# Patient Record
Sex: Female | Born: 1990 | Race: Black or African American | Hispanic: No | Marital: Single | Smoking: Never smoker
Health system: Southern US, Community
[De-identification: ages and names within clinical notes are randomized; demographics above are authoritative.]

## PROBLEM LIST (undated history)

## (undated) ENCOUNTER — Inpatient Hospital Stay (HOSPITAL_COMMUNITY): Payer: Medicaid Other

## (undated) HISTORY — PX: NO PAST SURGERIES: SHX2092

---
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
## 2010-03-20 ENCOUNTER — Inpatient Hospital Stay (HOSPITAL_COMMUNITY)
Admission: AD | Admit: 2010-03-20 | Discharge: 2010-03-21 | Payer: Self-pay | Source: Home / Self Care | Attending: Obstetrics & Gynecology | Admitting: Obstetrics & Gynecology

## 2010-04-06 NOTE — L&D Delivery Note (Signed)
Delivery Note At 12:20 AM a viable female was delivered via Vaginal, Spontaneous Delivery (Presentation: Left Occiput Anterior).  APGAR: 8, 9; weight 6 lb 13.4 oz (3100 g).   Placenta status: Intact, Spontaneous.  Cord: 3 vessels with the following complications: None.  Cord pH: not done  Anesthesia: Epidural  Episiotomy:  Lacerations: Sulcus;2nd degree;Perineal Suture Repair: 2.0 Est. Blood Loss (mL): 350  Mom to postpartum.  Baby to nursery-stable.  Kobe Ofallon A 10/31/2010, 12:47 AM

## 2010-05-27 ENCOUNTER — Inpatient Hospital Stay (HOSPITAL_COMMUNITY)
Admission: AD | Admit: 2010-05-27 | Discharge: 2010-05-27 | Disposition: A | Discharge status: Home / Self Care | Payer: Medicaid Other | Source: Ambulatory Visit | Attending: Obstetrics & Gynecology | Admitting: Obstetrics & Gynecology

## 2010-05-27 DIAGNOSIS — O210 Mild hyperemesis gravidarum: Secondary | ICD-10-CM | POA: Insufficient documentation

## 2010-05-27 LAB — URINALYSIS, ROUTINE W REFLEX MICROSCOPIC
Bilirubin Urine: NEGATIVE
Ketones, ur: 40 mg/dL — AB
Nitrite: NEGATIVE
Protein, ur: NEGATIVE mg/dL
Urobilinogen, UA: 0.2 mg/dL (ref 0.0–1.0)
pH: 5.5 (ref 5.0–8.0)

## 2010-05-29 ENCOUNTER — Inpatient Hospital Stay (HOSPITAL_COMMUNITY)
Admission: AD | Admit: 2010-05-29 | Discharge: 2010-05-29 | Disposition: A | Discharge status: Home / Self Care | Payer: Medicaid Other | Source: Ambulatory Visit | Attending: Obstetrics & Gynecology | Admitting: Obstetrics & Gynecology

## 2010-05-29 DIAGNOSIS — R109 Unspecified abdominal pain: Secondary | ICD-10-CM | POA: Insufficient documentation

## 2010-05-29 DIAGNOSIS — O9989 Other specified diseases and conditions complicating pregnancy, childbirth and the puerperium: Secondary | ICD-10-CM

## 2010-05-29 DIAGNOSIS — O99891 Other specified diseases and conditions complicating pregnancy: Secondary | ICD-10-CM | POA: Insufficient documentation

## 2010-05-29 LAB — URINALYSIS, ROUTINE W REFLEX MICROSCOPIC
Bilirubin Urine: NEGATIVE
Hgb urine dipstick: NEGATIVE
Specific Gravity, Urine: 1.02 (ref 1.005–1.030)
Urine Glucose, Fasting: NEGATIVE mg/dL
Urobilinogen, UA: 0.2 mg/dL (ref 0.0–1.0)
pH: 6 (ref 5.0–8.0)

## 2010-05-29 LAB — DIFFERENTIAL
Basophils Absolute: 0 10*3/uL (ref 0.0–0.1)
Basophils Relative: 0 % (ref 0–1)
Eosinophils Absolute: 0.1 10*3/uL (ref 0.0–0.7)
Eosinophils Relative: 1 % (ref 0–5)
Lymphs Abs: 1 10*3/uL (ref 0.7–4.0)
Neutrophils Relative %: 68 % (ref 43–77)

## 2010-05-29 LAB — COMPREHENSIVE METABOLIC PANEL
BUN: 6 mg/dL (ref 6–23)
CO2: 23 mEq/L (ref 19–32)
Chloride: 105 mEq/L (ref 96–112)
GFR calc Af Amer: >60 mL/min (ref >60)
GFR calc non Af Amer: >60 mL/min (ref >60)
Glucose, Bld: 78 mg/dL (ref 70–99)
Potassium: 3.3 mEq/L — ABNORMAL LOW (ref 3.5–5.1)
Total Bilirubin: 0.3 mg/dL (ref 0.3–1.2)

## 2010-05-29 LAB — CBC
Hemoglobin: 10 g/dL — ABNORMAL LOW (ref 12.0–15.0)
MCH: 25.5 pg — ABNORMAL LOW (ref 26.0–34.0)
Platelets: 233 10*3/uL (ref 150–400)
WBC: 5.3 10*3/uL (ref 4.0–10.5)

## 2010-05-29 LAB — WET PREP, GENITAL
Clue Cells Wet Prep HPF POC: NONE SEEN
Trich, Wet Prep: NONE SEEN
Yeast Wet Prep HPF POC: NONE SEEN

## 2010-06-16 LAB — CBC
HCT: 32.1 % — ABNORMAL LOW (ref 36.0–46.0)
Hemoglobin: 10.9 g/dL — ABNORMAL LOW (ref 12.0–15.0)
MCH: 25.2 pg — ABNORMAL LOW (ref 26.0–34.0)
MCHC: 34 g/dL (ref 30.0–36.0)
MCV: 74.3 fL — ABNORMAL LOW (ref 78.0–100.0)
RBC: 4.32 MIL/uL (ref 3.87–5.11)

## 2010-06-16 LAB — GC/CHLAMYDIA PROBE AMP, GENITAL: GC Probe Amp, Genital: NEGATIVE

## 2010-06-16 LAB — URINALYSIS, ROUTINE W REFLEX MICROSCOPIC
Bilirubin Urine: NEGATIVE
Glucose, UA: NEGATIVE mg/dL
Ketones, ur: 40 mg/dL — AB
Nitrite: NEGATIVE
Protein, ur: NEGATIVE mg/dL
Urobilinogen, UA: 1 mg/dL (ref 0.0–1.0)

## 2010-06-16 LAB — WET PREP, GENITAL
Trich, Wet Prep: NONE SEEN
Yeast Wet Prep HPF POC: NONE SEEN

## 2010-07-06 ENCOUNTER — Inpatient Hospital Stay (HOSPITAL_COMMUNITY): Admit: 2010-07-06 | Payer: Self-pay | Admitting: Obstetrics

## 2010-09-30 ENCOUNTER — Inpatient Hospital Stay (HOSPITAL_COMMUNITY)
Admission: AD | Admit: 2010-09-30 | Discharge: 2010-09-30 | Disposition: A | Discharge status: Home / Self Care | Payer: Medicaid Other | Source: Ambulatory Visit | Attending: Obstetrics | Admitting: Obstetrics

## 2010-09-30 DIAGNOSIS — O99891 Other specified diseases and conditions complicating pregnancy: Secondary | ICD-10-CM | POA: Insufficient documentation

## 2010-09-30 DIAGNOSIS — K5289 Other specified noninfective gastroenteritis and colitis: Secondary | ICD-10-CM

## 2010-09-30 DIAGNOSIS — O9989 Other specified diseases and conditions complicating pregnancy, childbirth and the puerperium: Secondary | ICD-10-CM

## 2010-09-30 LAB — URINALYSIS, ROUTINE W REFLEX MICROSCOPIC
Glucose, UA: NEGATIVE mg/dL
Hgb urine dipstick: NEGATIVE
Ketones, ur: 40 mg/dL — AB
Nitrite: NEGATIVE
Protein, ur: NEGATIVE mg/dL
Specific Gravity, Urine: 1.02 (ref 1.005–1.030)
Urobilinogen, UA: 0.2 mg/dL (ref 0.0–1.0)
pH: 6 (ref 5.0–8.0)

## 2010-09-30 LAB — URINE MICROSCOPIC-ADD ON

## 2010-10-30 ENCOUNTER — Encounter (HOSPITAL_COMMUNITY): Payer: Self-pay | Admitting: Anesthesiology

## 2010-10-30 ENCOUNTER — Inpatient Hospital Stay (HOSPITAL_COMMUNITY)
Admission: AD | Admit: 2010-10-30 | Discharge: 2010-11-02 | DRG: 775 | Disposition: A | Discharge status: Home / Self Care | Payer: Medicaid Other | Source: Ambulatory Visit | Attending: Obstetrics | Admitting: Obstetrics

## 2010-10-30 ENCOUNTER — Inpatient Hospital Stay (HOSPITAL_COMMUNITY): Payer: Medicaid Other | Admitting: Anesthesiology

## 2010-10-30 ENCOUNTER — Encounter (HOSPITAL_COMMUNITY): Payer: Self-pay | Admitting: *Deleted

## 2010-10-30 LAB — CBC
MCH: 24.6 pg — ABNORMAL LOW (ref 26.0–34.0)
Platelets: 180 10*3/uL (ref 150–400)
RBC: 4.06 MIL/uL (ref 3.87–5.11)
WBC: 5.5 10*3/uL (ref 4.0–10.5)

## 2010-10-30 MED ORDER — LACTATED RINGERS IV SOLN
INTRAVENOUS | Status: DC
  Administered 2010-10-30 (×2): via INTRAVENOUS

## 2010-10-30 MED ORDER — FLEET ENEMA 7-19 GM/118ML RE ENEM: 1.0000 | ENEMA | RECTAL | Status: DC | PRN

## 2010-10-30 MED ORDER — LACTATED RINGERS IV SOLN
500.0000 mL | Freq: Once | INTRAVENOUS | Status: AC
  Administered 2010-10-30: 1000 mL via INTRAVENOUS

## 2010-10-30 MED ORDER — PHENYLEPHRINE 40 MCG/ML (10ML) SYRINGE FOR IV PUSH (FOR BLOOD PRESSURE SUPPORT)
80.0000 ug | PREFILLED_SYRINGE | INTRAVENOUS | Status: DC | PRN
Start: 2010-10-30 — End: 2010-10-31
  Filled 2010-10-30: qty 5

## 2010-10-30 MED ORDER — ONDANSETRON HCL 4 MG/2ML IJ SOLN: 4.0000 mg | Freq: Four times a day (QID) | INTRAMUSCULAR | Status: DC | PRN

## 2010-10-30 MED ORDER — FENTANYL 2.5 MCG/ML BUPIVACAINE 1/10 % EPIDURAL INFUSION (WH - ANES)
INTRAMUSCULAR | Status: DC | PRN
  Administered 2010-10-30: 14 mL/h via EPIDURAL

## 2010-10-30 MED ORDER — LIDOCAINE HCL (PF) 1 % IJ SOLN
30.0000 mL | INTRAMUSCULAR | Status: DC | PRN
  Administered 2010-10-31: 30 mL via SUBCUTANEOUS
  Filled 2010-10-30: qty 30

## 2010-10-30 MED ORDER — OXYTOCIN 20 UNITS IN LACTATED RINGERS INFUSION - SIMPLE
125.0000 mL/h | Freq: Once | INTRAVENOUS | Status: DC
  Administered 2010-10-31: 125 mL/h via INTRAVENOUS
  Filled 2010-10-30: qty 1000

## 2010-10-30 MED ORDER — EPHEDRINE 5 MG/ML INJ
10.0000 mg | INTRAVENOUS | Status: DC | PRN
  Filled 2010-10-30: qty 4

## 2010-10-30 MED ORDER — DIPHENHYDRAMINE HCL 50 MG/ML IJ SOLN: 12.5000 mg | INTRAMUSCULAR | Status: DC | PRN

## 2010-10-30 MED ORDER — CITRIC ACID-SODIUM CITRATE 334-500 MG/5ML PO SOLN: 30.0000 mL | ORAL | Status: DC | PRN

## 2010-10-30 MED ORDER — EPHEDRINE 5 MG/ML INJ
10.0000 mg | INTRAVENOUS | Status: DC | PRN
  Filled 2010-10-30 (×2): qty 4

## 2010-10-30 MED ORDER — PHENYLEPHRINE 40 MCG/ML (10ML) SYRINGE FOR IV PUSH (FOR BLOOD PRESSURE SUPPORT)
80.0000 ug | PREFILLED_SYRINGE | INTRAVENOUS | Status: DC | PRN
  Filled 2010-10-30 (×2): qty 5

## 2010-10-30 MED ORDER — FENTANYL 2.5 MCG/ML BUPIVACAINE 1/10 % EPIDURAL INFUSION (WH - ANES)
14.0000 mL/h | INTRAMUSCULAR | Status: DC
  Filled 2010-10-30: qty 60

## 2010-10-30 MED ORDER — LACTATED RINGERS IV SOLN: 500.0000 mL | INTRAVENOUS | Status: DC | PRN

## 2010-10-30 NOTE — Anesthesia Procedure Notes (Addendum)
Epidural Patient location during procedure: OB Start time: 10/30/2010 11:07 PM End time: 10/30/2010 11:19 PM  Staffing Anesthesiologist: Sandrea Hughs Performed by: anesthesiologist   Preanesthetic Checklist Completed: patient identified, site marked, surgical consent, pre-op evaluation, timeout performed, IV checked, risks and benefits discussed and monitors and equipment checked  Epidural Patient position: sitting Prep: site prepped and draped and DuraPrep Patient monitoring: continuous pulse ox and blood pressure Approach: midline Injection technique: LOR air  Needle:  Needle type: Tuohy  Needle gauge: 17 G Needle length: 9 cm Needle insertion depth: 5 cm cm Catheter type: closed end flexible Catheter size: 19 Gauge Catheter at skin depth: 10 cm Test dose: negative and 1.5% lidocaine  Assessment Sensory level: T8 Events: blood not aspirated, injection not painful, no injection resistance, negative IV test and no paresthesia  Additional Notes Pt comfortable. See nursing notes for VS and FHR

## 2010-10-30 NOTE — Anesthesia Preprocedure Evaluation (Signed)
Anesthesia Evaluation  Name, MR# and DOB Patient awake  General Assessment Comment  Reviewed: Allergy & Precautions, H&P  and Patient's Chart, lab work & pertinent test results  Airway Mallampati: II TM Distance: >3 FB Neck ROM: full    Dental No notable dental hx    Pulmonaryneg pulmonary ROS      pulmonary exam normal   Cardiovascular regular Normal   Neuro/PsychNegative Neurological ROS Negative Psych ROS  GI/Hepatic/Renal negative GI ROS, negative Liver ROS, and negative Renal ROS (+)       Endo/Other  Negative Endocrine ROS (+)   Abdominal   Musculoskeletal  Hematology negative hematology ROS (+)   Peds  Reproductive/Obstetrics (+) Pregnancy   Anesthesia Other Findings             Anesthesia Physical Anesthesia Plan  ASA: II  Anesthesia Plan: Epidural   Post-op Pain Management:    Induction:   Airway Management Planned:   Additional Equipment:   Intra-op Plan:   Post-operative Plan:   Informed Consent: I have reviewed the patients History and Physical, chart, labs and discussed the procedure including the risks, benefits and alternatives for the proposed anesthesia with the patient or authorized representative who has indicated his/her understanding and acceptance.     Plan Discussed with:   Anesthesia Plan Comments:         Anesthesia Quick Evaluation  

## 2010-10-31 ENCOUNTER — Encounter (HOSPITAL_COMMUNITY): Payer: Self-pay | Admitting: General Practice

## 2010-10-31 LAB — CBC
Hemoglobin: 8.9 g/dL — ABNORMAL LOW (ref 12.0–15.0)
RBC: 3.61 MIL/uL — ABNORMAL LOW (ref 3.87–5.11)

## 2010-10-31 LAB — RPR: RPR Ser Ql: NONREACTIVE

## 2010-10-31 MED ORDER — SODIUM CHLORIDE 0.9 % IV SOLN: 250.0000 mL | INTRAVENOUS | Status: DC

## 2010-10-31 MED ORDER — OXYCODONE-ACETAMINOPHEN 5-325 MG PO TABS
1.0000 | ORAL_TABLET | ORAL | Status: DC | PRN
  Administered 2010-10-31 (×3): 1 via ORAL
  Administered 2010-10-31: 2 via ORAL
  Administered 2010-10-31 – 2010-11-01 (×5): 1 via ORAL
  Administered 2010-11-01: 2 via ORAL
  Administered 2010-11-01 – 2010-11-02 (×5): 1 via ORAL
  Administered 2010-11-02: 2 via ORAL
  Filled 2010-10-31 (×2): qty 1
  Filled 2010-10-31 (×7): qty 2
  Filled 2010-10-31 (×3): qty 1
  Filled 2010-10-31: qty 2
  Filled 2010-10-31: qty 1
  Filled 2010-10-31 (×2): qty 2
  Filled 2010-10-31: qty 1
  Filled 2010-10-31 (×4): qty 2
  Filled 2010-10-31: qty 1

## 2010-10-31 MED ORDER — BENZOCAINE-MENTHOL 20-0.5 % EX AERO
INHALATION_SPRAY | CUTANEOUS | Status: AC
  Administered 2010-10-31: 1 via TOPICAL
  Filled 2010-10-31: qty 56

## 2010-10-31 MED ORDER — DIPHENHYDRAMINE HCL 25 MG PO CAPS: 25.0000 mg | ORAL_CAPSULE | Freq: Four times a day (QID) | ORAL | Status: DC | PRN

## 2010-10-31 MED ORDER — SODIUM CHLORIDE 0.9 % IJ SOLN: 3.0000 mL | INTRAMUSCULAR | Status: DC | PRN

## 2010-10-31 MED ORDER — ZOLPIDEM TARTRATE 5 MG PO TABS: 5.0000 mg | ORAL_TABLET | Freq: Every evening | ORAL | Status: DC | PRN

## 2010-10-31 MED ORDER — FERROUS SULFATE 325 (65 FE) MG PO TABS
325.0000 mg | ORAL_TABLET | Freq: Two times a day (BID) | ORAL | Status: DC
  Administered 2010-10-31 – 2010-11-02 (×5): 325 mg via ORAL
  Filled 2010-10-31 (×5): qty 1

## 2010-10-31 MED ORDER — BENZOCAINE-MENTHOL 20-0.5 % EX AERO
1.0000 "application " | INHALATION_SPRAY | CUTANEOUS | Status: DC | PRN
  Administered 2010-10-31: 1 via TOPICAL

## 2010-10-31 MED ORDER — LANOLIN HYDROUS EX OINT: TOPICAL_OINTMENT | CUTANEOUS | Status: DC | PRN

## 2010-10-31 MED ORDER — OXYTOCIN 20 UNITS IN LACTATED RINGERS INFUSION - SIMPLE: 125.0000 mL/h | INTRAVENOUS | Status: DC | PRN

## 2010-10-31 MED ORDER — WITCH HAZEL-GLYCERIN EX PADS: MEDICATED_PAD | CUTANEOUS | Status: DC | PRN

## 2010-10-31 MED ORDER — SODIUM CHLORIDE 0.9 % IJ SOLN: 3.0000 mL | Freq: Two times a day (BID) | INTRAMUSCULAR | Status: DC

## 2010-10-31 MED ORDER — IBUPROFEN 600 MG PO TABS
600.0000 mg | ORAL_TABLET | Freq: Four times a day (QID) | ORAL | Status: DC
  Administered 2010-10-31 – 2010-11-02 (×10): 600 mg via ORAL
  Filled 2010-10-31 (×10): qty 1

## 2010-10-31 NOTE — Progress Notes (Signed)
[facility] INFORMATION GIVEN TO PATIENT.  PATIENT STATES THE BABY IS LATCHING WELL BUT SHE CHOOSES TO GIVE BOTTLES ALSO.  ENCOURAGED TO CALL WITH QUESTIONS OR ASSIST.

## 2010-10-31 NOTE — Progress Notes (Signed)
  Vital signs normal Fundus firm Lochia moderate Legs negative

## 2010-10-31 NOTE — Anesthesia Postprocedure Evaluation (Signed)
Vital signs stable Patient alert Pain and nausea are controlled No apparent anesthetic complications No follow up care needed Pt may be d/c when legs are bending

## 2010-10-31 NOTE — H&P (Signed)
This is Dr. Francoise Ceo dictating the history and physical on blank blank She's a 20 year old gravida 1 para 1001 at 38 weeks and 2 days who was admitted in labor 3 cm 90% vertex at +1 station Negative GBS unknown allergic to Tylox and aspirin On admission she under epidural and made rapid progress She had a normal vaginal delivery of a female Apgar 8 placenta was spontaneous HEENT Negative  Breasts negative Heart regular rhythm no murmurs no gallops Lungs clear to P&A Abdomen 20 week postpartum size Pelvic as described above Extremities negative

## 2010-10-31 NOTE — Progress Notes (Signed)
UR Chart review completed.  

## 2010-10-31 NOTE — Anesthesia Postprocedure Evaluation (Signed)
  Anesthesia Post-op Note  Patient: Kathy Hardin  Procedure(s) Performed: * No procedures listed *  Patient Location: Rm 131  Anesthesia Type: Epidural  Level of Consciousness: awake, alert , oriented and patient cooperative  Airway and Oxygen Therapy: Patient Spontanous Breathing   Post-op Assessment: Post-op Vital signs reviewed and Patient's Cardiovascular Status Stable  Post-op Vital Signs: Reviewed and stable  Complications: No apparent anesthesia complications

## 2010-10-31 NOTE — Progress Notes (Signed)
I/O cath  . 

## 2010-11-01 MED ORDER — DOCUSATE SODIUM 100 MG PO CAPS
100.0000 mg | ORAL_CAPSULE | Freq: Two times a day (BID) | ORAL | Status: DC | PRN
  Administered 2010-11-01: 100 mg via ORAL
  Filled 2010-11-01: qty 1

## 2010-11-01 NOTE — Progress Notes (Signed)
  Post Partum Day 1 S/P spontaneous vaginal RH status/Rubella reviewed.  Feeding: breast Subjective: No HA, SOB, CP, F/C, breast symptoms. Normal vaginal bleeding, no clots.     Objective: BP 100/63  Pulse 80  Temp(Src) 98.1 F (36.7 C) (Oral)  Resp 18  Ht 5\' 5"  (1.651 m)  Wt 87.544 kg (193 lb)  BMI 32.12 kg/m2  SpO2 95%  Breastfeeding? Unknown   Physical Exam:  General: alert Lochia: appropriate Uterine Fundus: firm DVT Evaluation: No evidence of DVT seen on physical exam. Ext: No c/c/e  Basename 10/31/10 0525 10/30/10 2228  HGB 8.9* 10.0*  HCT 26.9* 30.5*      Assessment/Plan: 20 y.o.  PPD #1 .  normal postpartum exam Continue current postpartum care  Ambulate   LOS: 2 days   JACKSON-MOORE,Rico Massar A 11/01/2010, 10:18 AM

## 2010-11-01 NOTE — Progress Notes (Signed)
Basic breastfeeding review. Assisted with good latch. Audible swallowing with rhythmic sucking pattern, hand pump given , mom pumped 10 ml colostrum.

## 2010-11-02 MED ORDER — OXYCODONE-ACETAMINOPHEN 5-325 MG PO TABS: 2.0000 | ORAL_TABLET | Freq: Four times a day (QID) | ORAL | Status: AC | PRN

## 2010-11-02 NOTE — Progress Notes (Signed)
Mother breast fed several times during the night and bottle fed as well., encouraged to pump and use own milk.

## 2010-11-02 NOTE — Discharge Summary (Signed)
  Obstetric Discharge Summary Reason for Admission: onset of labor Prenatal Procedures: none Intrapartum Procedures: spontaneous vaginal delivery Postpartum Procedures: none Complications-Operative and Postpartum: none  Hemoglobin  Date Value Range Status  10/31/2010 8.9* 12.0-15.0 (g/dL) Final     HCT  Date Value Range Status  10/31/2010 26.9* 36.0-46.0 (%) Final    Discharge Diagnoses: Term Pregnancy-delivered  Discharge Information: Date: 11/02/2010 Activity: pelvic rest Diet: routine Medications: Ibuprophen Condition: stable Instructions: refer to routine discharge instructions Discharge to: home Follow-up Information    Follow up with MARSHALL,BERNARD A, MD. Call in 6 weeks.   Contact information:   [address] [place] [address] [phone]          Newborn Data: Live born  Information for the patient's newborn:  Rafia, Shedden [[id number]]  female ; APGAR , ; weight ;  Home with mother.  JACKSON-MOORE,Vencent Hauschild A 11/02/2010, 9:15 AM

## 2010-11-02 NOTE — Progress Notes (Signed)
  Post Partum Day #2 S/P:spontaneous vaginal  RH status/Rubella reviewed.  Feeding: bottle Subjective: No HA, SOB, CP, F/C, breast symptoms: No:. Normal vaginal bleeding, no clots.     Objective:  Blood pressure 103/62, pulse 77, temperature 98.3 F (36.8 C), temperature source Oral, resp. rate 18, height 5\' 5"  (1.651 m), weight 87.544 kg (193 lb), SpO2 95.00%, unknown if currently breastfeeding.   Physical Exam:  General: alert Lochia: appropriate Uterine Fundus: firm DVT Evaluation: No evidence of DVT seen on physical exam. Ext: No c/c/e  Basename 10/31/10 0525 10/30/10 2228  HGB 8.9* 10.0*  HCT 26.9* 30.5*    Assessment/Plan: 19 y.o.  PPD # 2 .  normal postpartum exam Continue current postpartum care D/C home   LOS: 3 days   JACKSON-MOORE,Jeremia Groot A 11/02/2010, 9:12 AM

## 2011-10-09 ENCOUNTER — Emergency Department: Payer: Self-pay | Admitting: *Deleted

## 2011-10-09 LAB — URINALYSIS, COMPLETE
Bilirubin,UR: NEGATIVE
Blood: NEGATIVE
Nitrite: NEGATIVE
Ph: 5 (ref 4.5–8.0)
Protein: NEGATIVE
Specific Gravity: 1.018 (ref 1.003–1.030)

## 2011-10-09 LAB — COMPREHENSIVE METABOLIC PANEL
Bilirubin,Total: 0.2 mg/dL (ref 0.2–1.0)
Chloride: 109 mmol/L — ABNORMAL HIGH (ref 98–107)
Co2: 28 mmol/L (ref 21–32)
Creatinine: 1.13 mg/dL (ref 0.60–1.30)
Osmolality: 281 (ref 275–301)
SGOT(AST): 14 U/L — ABNORMAL LOW (ref 15–37)
SGPT (ALT): 12 U/L

## 2011-10-09 LAB — CBC
MCV: 78 fL — ABNORMAL LOW (ref 80–100)
Platelet: 212 10*3/uL (ref 150–440)
RBC: 4.6 10*6/uL (ref 3.80–5.20)
RDW: 15.5 % — ABNORMAL HIGH (ref 11.5–14.5)
WBC: 6.8 10*3/uL (ref 3.6–11.0)

## 2011-10-09 LAB — PREGNANCY, URINE: Pregnancy Test, Urine: NEGATIVE m[IU]/mL

## 2011-11-12 ENCOUNTER — Emergency Department: Payer: Self-pay | Admitting: Emergency Medicine

## 2011-11-12 LAB — URINALYSIS, COMPLETE
Blood: NEGATIVE
Glucose,UR: NEGATIVE mg/dL (ref 0–75)
Nitrite: NEGATIVE
Protein: NEGATIVE

## 2011-11-12 LAB — COMPREHENSIVE METABOLIC PANEL
Alkaline Phosphatase: 54 U/L (ref 50–136)
Bilirubin,Total: 0.2 mg/dL (ref 0.2–1.0)
Calcium, Total: 8.8 mg/dL (ref 8.5–10.1)
Chloride: 107 mmol/L (ref 98–107)
Co2: 25 mmol/L (ref 21–32)
Creatinine: 0.64 mg/dL (ref 0.60–1.30)
EGFR (African American): >60
EGFR (Non-African Amer.): >60
Osmolality: 276 (ref 275–301)
Potassium: 3.7 mmol/L (ref 3.5–5.1)
SGPT (ALT): 10 U/L — ABNORMAL LOW (ref 12–78)

## 2011-11-12 LAB — CBC
HCT: 35.7 % (ref 35.0–47.0)
HGB: 12.1 g/dL (ref 12.0–16.0)
RBC: 4.63 10*6/uL (ref 3.80–5.20)
WBC: 6.3 10*3/uL (ref 3.6–11.0)

## 2011-11-12 LAB — PREGNANCY, URINE: Pregnancy Test, Urine: POSITIVE m[IU]/mL

## 2012-02-08 ENCOUNTER — Emergency Department: Payer: Self-pay | Admitting: Emergency Medicine

## 2012-02-08 LAB — URINALYSIS, COMPLETE
Bacteria: NONE SEEN
Bilirubin,UR: NEGATIVE
Leukocyte Esterase: NEGATIVE
Ph: 5 (ref 4.5–8.0)
Protein: NEGATIVE

## 2012-09-21 ENCOUNTER — Emergency Department: Payer: Self-pay | Admitting: Emergency Medicine

## 2012-09-21 LAB — COMPREHENSIVE METABOLIC PANEL
Albumin: 3.5 g/dL (ref 3.4–5.0)
Anion Gap: 5 — ABNORMAL LOW (ref 7–16)
BUN: 9 mg/dL (ref 7–18)
Co2: 25 mmol/L (ref 21–32)
Creatinine: 0.71 mg/dL (ref 0.60–1.30)
EGFR (Non-African Amer.): >60
SGPT (ALT): 11 U/L — ABNORMAL LOW (ref 12–78)

## 2012-09-21 LAB — URINALYSIS, COMPLETE
Bilirubin,UR: NEGATIVE
Glucose,UR: NEGATIVE mg/dL (ref 0–75)
Ketone: NEGATIVE
Nitrite: NEGATIVE
Protein: NEGATIVE
RBC,UR: <1 /HPF (ref 0–5)

## 2012-09-21 LAB — CBC
HCT: 33.7 % — ABNORMAL LOW (ref 35.0–47.0)
MCH: 26.6 pg (ref 26.0–34.0)
MCHC: 34 g/dL (ref 32.0–36.0)
MCV: 78 fL — ABNORMAL LOW (ref 80–100)
RBC: 4.32 10*6/uL (ref 3.80–5.20)

## 2012-09-21 LAB — LIPASE, BLOOD: Lipase: 83 U/L (ref 73–393)

## 2012-09-22 LAB — HCG, QUANTITATIVE, PREGNANCY: Beta Hcg, Quant.: 67842 m[IU]/mL — ABNORMAL HIGH

## 2012-10-18 ENCOUNTER — Ambulatory Visit: Payer: Self-pay | Admitting: Family Medicine

## 2013-04-10 ENCOUNTER — Observation Stay: Payer: Self-pay

## 2013-04-11 ENCOUNTER — Observation Stay: Payer: Self-pay | Admitting: Obstetrics and Gynecology

## 2013-04-18 ENCOUNTER — Inpatient Hospital Stay: Payer: Self-pay | Admitting: Obstetrics and Gynecology

## 2013-04-19 LAB — CBC WITH DIFFERENTIAL/PLATELET
BASOS ABS: 0 10*3/uL (ref 0.0–0.1)
BASOS PCT: 0.3 %
EOS PCT: 0.7 %
Eosinophil #: 0.1 10*3/uL (ref 0.0–0.7)
HCT: 29.3 % — ABNORMAL LOW (ref 35.0–47.0)
HGB: 9.8 g/dL — ABNORMAL LOW (ref 12.0–16.0)
LYMPHS PCT: 19.6 %
Lymphocyte #: 1.5 10*3/uL (ref 1.0–3.6)
MCH: 24.6 pg — AB (ref 26.0–34.0)
MCHC: 33.6 g/dL (ref 32.0–36.0)
MCV: 73 fL — AB (ref 80–100)
Monocyte #: 0.5 x10 3/mm (ref 0.2–0.9)
Monocyte %: 6.7 %
NEUTROS PCT: 72.7 %
Neutrophil #: 5.7 10*3/uL (ref 1.4–6.5)
Platelet: 224 10*3/uL (ref 150–440)
RBC: 4 10*6/uL (ref 3.80–5.20)
RDW: 15.1 % — AB (ref 11.5–14.5)
WBC: 7.8 10*3/uL (ref 3.6–11.0)

## 2013-04-19 LAB — WOUND CULTURE

## 2013-04-20 LAB — HEMOGLOBIN: HGB: 9.2 g/dL — AB (ref 12.0–16.0)

## 2013-12-30 ENCOUNTER — Emergency Department: Payer: Self-pay | Admitting: Emergency Medicine

## 2013-12-30 LAB — URINALYSIS, COMPLETE
BILIRUBIN, UR: NEGATIVE
Bacteria: NONE SEEN
Blood: NEGATIVE
Glucose,UR: NEGATIVE mg/dL (ref 0–75)
Ketone: NEGATIVE
Nitrite: NEGATIVE
Ph: 6 (ref 4.5–8.0)
Protein: NEGATIVE
RBC,UR: 1 /HPF (ref 0–5)
SPECIFIC GRAVITY: 1.013 (ref 1.003–1.030)

## 2013-12-30 LAB — CBC WITH DIFFERENTIAL/PLATELET
BASOS ABS: 0 10*3/uL (ref 0.0–0.1)
BASOS PCT: 0.5 %
EOS ABS: 0.1 10*3/uL (ref 0.0–0.7)
Eosinophil %: 1.5 %
HCT: 30.5 % — AB (ref 35.0–47.0)
HGB: 9.6 g/dL — ABNORMAL LOW (ref 12.0–16.0)
LYMPHS ABS: 1 10*3/uL (ref 1.0–3.6)
Lymphocyte %: 16.6 %
MCH: 24.3 pg — AB (ref 26.0–34.0)
MCHC: 31.5 g/dL — ABNORMAL LOW (ref 32.0–36.0)
MCV: 77 fL — ABNORMAL LOW (ref 80–100)
MONO ABS: 0.3 x10 3/mm (ref 0.2–0.9)
MONOS PCT: 4.9 %
NEUTROS ABS: 4.8 10*3/uL (ref 1.4–6.5)
Neutrophil %: 76.5 %
Platelet: 202 10*3/uL (ref 150–440)
RBC: 3.95 10*6/uL (ref 3.80–5.20)
RDW: 13.9 % (ref 11.5–14.5)
WBC: 6.3 10*3/uL (ref 3.6–11.0)

## 2013-12-30 LAB — COMPREHENSIVE METABOLIC PANEL
ALT: 8 U/L — AB
Albumin: 2.3 g/dL — ABNORMAL LOW (ref 3.4–5.0)
Alkaline Phosphatase: 75 U/L
Anion Gap: 5 — ABNORMAL LOW (ref 7–16)
BUN: 3 mg/dL — AB (ref 7–18)
Bilirubin,Total: 0.1 mg/dL — ABNORMAL LOW (ref 0.2–1.0)
CALCIUM: 7.7 mg/dL — AB (ref 8.5–10.1)
CO2: 26 mmol/L (ref 21–32)
CREATININE: 0.58 mg/dL — AB (ref 0.60–1.30)
Chloride: 109 mmol/L — ABNORMAL HIGH (ref 98–107)
EGFR (Non-African Amer.): >60
GLUCOSE: 75 mg/dL (ref 65–99)
OSMOLALITY: 275 (ref 275–301)
POTASSIUM: 3.2 mmol/L — AB (ref 3.5–5.1)
SGOT(AST): 13 U/L — ABNORMAL LOW (ref 15–37)
Sodium: 140 mmol/L (ref 136–145)
TOTAL PROTEIN: 6.5 g/dL (ref 6.4–8.2)

## 2013-12-30 LAB — LIPASE, BLOOD: Lipase: 56 U/L — ABNORMAL LOW (ref 73–393)

## 2013-12-30 LAB — WET PREP, GENITAL

## 2013-12-30 LAB — HCG, QUANTITATIVE, PREGNANCY: BETA HCG, QUANT.: 11384 m[IU]/mL — AB

## 2014-01-01 LAB — URINE CULTURE

## 2014-02-05 ENCOUNTER — Encounter (HOSPITAL_COMMUNITY): Payer: Self-pay | Admitting: General Practice

## 2014-02-27 ENCOUNTER — Inpatient Hospital Stay: Payer: Self-pay | Admitting: Obstetrics & Gynecology

## 2014-02-27 LAB — CBC WITH DIFFERENTIAL/PLATELET
Basophil #: 0 10*3/uL (ref 0.0–0.1)
Basophil %: 0.4 %
EOS PCT: 0.6 %
Eosinophil #: 0 10*3/uL (ref 0.0–0.7)
HCT: 29.5 % — AB (ref 35.0–47.0)
HGB: 9.5 g/dL — AB (ref 12.0–16.0)
LYMPHS PCT: 18.6 %
Lymphocyte #: 1.3 10*3/uL (ref 1.0–3.6)
MCH: 23.9 pg — ABNORMAL LOW (ref 26.0–34.0)
MCHC: 32.3 g/dL (ref 32.0–36.0)
MCV: 74 fL — AB (ref 80–100)
MONO ABS: 0.5 x10 3/mm (ref 0.2–0.9)
MONOS PCT: 6.9 %
Neutrophil #: 5.1 10*3/uL (ref 1.4–6.5)
Neutrophil %: 73.5 %
Platelet: 223 10*3/uL (ref 150–440)
RBC: 3.99 10*6/uL (ref 3.80–5.20)
RDW: 16.1 % — AB (ref 11.5–14.5)
WBC: 6.9 10*3/uL (ref 3.6–11.0)

## 2014-02-28 LAB — DRUG SCREEN, URINE
Amphetamines, Ur Screen: NEGATIVE (ref ?–1000)
BARBITURATES, UR SCREEN: NEGATIVE (ref ?–200)
Benzodiazepine, Ur Scrn: NEGATIVE (ref ?–200)
CANNABINOID 50 NG, UR ~~LOC~~: POSITIVE (ref ?–50)
COCAINE METABOLITE, UR ~~LOC~~: NEGATIVE (ref ?–300)
MDMA (Ecstasy)Ur Screen: NEGATIVE (ref ?–500)
METHADONE, UR SCREEN: NEGATIVE (ref ?–300)
Opiate, Ur Screen: POSITIVE (ref ?–300)
PHENCYCLIDINE (PCP) UR S: NEGATIVE (ref ?–25)
TRICYCLIC, UR SCREEN: NEGATIVE (ref ?–1000)

## 2014-02-28 LAB — GC/CHLAMYDIA PROBE AMP

## 2014-08-14 NOTE — H&P (Signed)
L&D Evaluation:  History Expanded:  HPI 24 yo [id number] at 85 5/7 weeks w spontaneous rupture of membranes 1700 and mild contractions. Late entry to Prenatal Care at [facility] at 33 weeks.  Short interval from last pregnancy.  MJ use in pregnancy.  Desires postpartum Bilateral Tubal Ligation.   Gravida 5   Term 2   PreTerm 0   Abortion 2   Living 2   Blood Type (Maternal) O positive   Group B Strep Results Maternal (Result >5wks must be treated as unknown) unknown/result > 5 weeks ago   [facility] 29-Mar-2014   Presents with leaking fluid   Patient's Medical History No Chronic Illness   Patient's Surgical History none   Medications none   Allergies ASA   Social History drugs   Family History Non-Contributory   ROS:  ROS All systems were reviewed.  HEENT, CNS, GI, GU, Respiratory, CV, Renal and Musculoskeletal systems were found to be normal.   Exam:  Vital Signs stable   General no apparent distress   Mental Status clear   Abdomen gravid, tender with contractions   Estimated Fetal Weight Average for gestational age   Edema no edema   Pelvic no external lesions, 6/80/-2   Mebranes Ruptured   Description clear   FHT normal rate with no decels   Ucx irregular   Impression:  Impression early labor, w spontaneous rupture of membranes   Plan:  Plan EFM/NST, monitor contractions and for cervical change, antibiotics for GBBS prophylaxis, fluids   Comments supplement w Pitocin after first ABX  Analgesia PRN UDS  Bilateral Tubal Ligation at interval due to signing papers yesterday   Electronic Signatures: Hoyt Koch (MD)  (Signed [phone] 22:35)  Authored: L&D Evaluation   Last Updated: 24-Nov-15 22:35 by Hoyt Koch (MD)

## 2014-08-14 NOTE — H&P (Signed)
L&D Evaluation:  History:  HPI 24 y/o G3P1011 BF PNC at [facility]   Presents with contractions   Patient's Medical History Colon trait   Allergies ASA   Social History none   Family History Non-Contributory   ROS:  ROS All systems were reviewed.  HEENT, CNS, GI, GU, Respiratory, CV, Renal and Musculoskeletal systems were found to be normal.   Exam:  Vital Signs stable   General no apparent distress   Mental Status clear   Abdomen gravid, tender with contractions   Estimated Fetal Weight Average for gestational age   Back no CVAT   Reflexes 1+   Clonus negative   Pelvic abscess at vulva recently drained and Tx'd with TMP/SMZ   Mebranes Intact   FHT normal rate with no decels   Ucx regular   Impression:  Impression active labor   Plan:  Comments Antic. SVD   Electronic Signatures: Edison Nasuti (MD)  (Signed 14-Jan-15 00:20)  Authored: L&D Evaluation   Last Updated: 14-Jan-15 00:20 by Edison Nasuti (MD)

## 2014-08-14 NOTE — H&P (Signed)
L&D Evaluation:  History:  HPI 24 YO G3P1011 with LMP of 08/02/12 anbd EDD of 05/12/13 with [facility] and L/EDd of 05/08/13 here for Rt inguinal abcess approx 2 cm which is hurting severly and very tense and cannot stand the infection much longer. Pt has had abcesses in the past.   Presents with Rt inguinal abcess   Patient's Medical History Anemia, Sickle cell trait   Patient's Surgical History none   Medications Pre Natal Vitamins   Allergies ASA causes hives and SOB   Social History none   Family History Non-Contributory   ROS:  ROS All systems were reviewed.  HEENT, CNS, GI, GU, Respiratory, CV, Renal and Musculoskeletal systems were found to be normal.   Exam:  Vital Signs stable   General no apparent distress   Mental Status clear   Chest clear   Heart normal sinus rhythm, no murmur/gallop/rubs   Abdomen gravid, non-tender   Estimated Fetal Weight Average for gestational age   Back no CVAT   Edema 1+   Reflexes 1+   Clonus negative   Pelvic not evaluated   Mebranes Intact   FHT normal rate with no decels   Ucx absent   Skin dry   Lymph no lymphadenopathy   Impression:  Impression IUP at 36 weeks with Rt inguinal abcess   Plan:  Plan I&D abcess and start antibiotics   Comments Will dc home on antibiotics. FHR has 1 accel tthat is 10 x 10 BPM and 1 accel 15 x 15 bpm so until, FHR  is reactive with 2 accels 15 x 15 bpm, she cannot be discharged.  Procedure: Verbal consent obtained from the pt for I&D of Rt inguinal abcess. 20% Benzocaine applied to the area for 10 mins prior to injection with 2% xylocaine x 8 cc's.  After betadine prep, a #11 blade was used to incise the area and purulent pus drained with a very foul odor. Most of the abcess was removed but, the Rt lower part of labia had indurated area that could not be drained. After draing, a pressure dressing was applied. Pt advised to clean with soap and water and use bandaid.FU in 48 hrs with  [facility] at [facility].  Pt was given script for MRSA coverage being Bactrim DS 800/160 1 po BID x 14 days, #28. Disc Cat D status and that baby already has had develpment of palate and the only risk was jaundice for the fetus and pt agreed the benefits outweigh the risks. Pt also given Vicodin #20 5/300 1 po for pain.   Follow Up Appointment FU at [facility] in 48 hours   Electronic Signatures: Catheryn Bacon (CNM)  (Signed 05-Jan-15 23:23)  Authored: L&D Evaluation   Last Updated: 05-Jan-15 23:23 by Catheryn Bacon (CNM)

## 2015-03-08 ENCOUNTER — Emergency Department
Admission: EM | Admit: 2015-03-08 | Discharge: 2015-03-08 | Disposition: A | Discharge status: Home / Self Care | Payer: Medicaid Other | Attending: Emergency Medicine | Admitting: Emergency Medicine

## 2015-03-08 ENCOUNTER — Encounter: Payer: Self-pay | Admitting: Medical Oncology

## 2015-03-08 DIAGNOSIS — R112 Nausea with vomiting, unspecified: Secondary | ICD-10-CM | POA: Insufficient documentation

## 2015-03-08 DIAGNOSIS — M545 Low back pain: Secondary | ICD-10-CM | POA: Insufficient documentation

## 2015-03-08 DIAGNOSIS — N939 Abnormal uterine and vaginal bleeding, unspecified: Secondary | ICD-10-CM | POA: Insufficient documentation

## 2015-03-08 DIAGNOSIS — Z3202 Encounter for pregnancy test, result negative: Secondary | ICD-10-CM | POA: Insufficient documentation

## 2015-03-08 LAB — URINALYSIS COMPLETE WITH MICROSCOPIC (ARMC ONLY)
BILIRUBIN URINE: NEGATIVE
Bacteria, UA: NONE SEEN
GLUCOSE, UA: NEGATIVE mg/dL
HGB URINE DIPSTICK: NEGATIVE
KETONES UR: NEGATIVE mg/dL
LEUKOCYTES UA: NEGATIVE
NITRITE: NEGATIVE
PH: 6 (ref 5.0–8.0)
Protein, ur: NEGATIVE mg/dL
SPECIFIC GRAVITY, URINE: 1.019 (ref 1.005–1.030)

## 2015-03-08 LAB — COMPREHENSIVE METABOLIC PANEL
ALBUMIN: 4 g/dL (ref 3.5–5.0)
ALK PHOS: 46 U/L (ref 38–126)
ALT: 10 U/L — AB (ref 14–54)
ANION GAP: 8 (ref 5–15)
AST: 13 U/L — AB (ref 15–41)
BILIRUBIN TOTAL: 0.4 mg/dL (ref 0.3–1.2)
BUN: 11 mg/dL (ref 6–20)
CALCIUM: 8.9 mg/dL (ref 8.9–10.3)
CO2: 26 mmol/L (ref 22–32)
CREATININE: 0.67 mg/dL (ref 0.44–1.00)
Chloride: 108 mmol/L (ref 101–111)
GFR calc Af Amer: >60 mL/min (ref >60)
GFR calc non Af Amer: >60 mL/min (ref >60)
GLUCOSE: 96 mg/dL (ref 65–99)
Potassium: 3.3 mmol/L — ABNORMAL LOW (ref 3.5–5.1)
Sodium: 142 mmol/L (ref 135–145)
TOTAL PROTEIN: 7.4 g/dL (ref 6.5–8.1)

## 2015-03-08 LAB — POCT PREGNANCY, URINE: Preg Test, Ur: NEGATIVE

## 2015-03-08 LAB — CBC
HCT: 39 % (ref 35.0–47.0)
Hemoglobin: 12.6 g/dL (ref 12.0–16.0)
MCH: 25.6 pg — ABNORMAL LOW (ref 26.0–34.0)
MCHC: 32.4 g/dL (ref 32.0–36.0)
MCV: 79.1 fL — ABNORMAL LOW (ref 80.0–100.0)
PLATELETS: 227 10*3/uL (ref 150–440)
RBC: 4.93 MIL/uL (ref 3.80–5.20)
RDW: 15.6 % — AB (ref 11.5–14.5)
WBC: 4.1 10*3/uL (ref 3.6–11.0)

## 2015-03-08 LAB — HCG, QUANTITATIVE, PREGNANCY: hCG, Beta Chain, Quant, S: <1 m[IU]/mL (ref <5)

## 2015-03-08 LAB — LIPASE, BLOOD: Lipase: 19 U/L (ref 11–51)

## 2015-03-08 MED ORDER — PROMETHAZINE HCL 25 MG PO TABS: 25.0000 mg | ORAL_TABLET | Freq: Four times a day (QID) | ORAL | Status: DC | PRN

## 2015-03-08 NOTE — ED Provider Notes (Signed)
[facility] Emergency Department Provider Note     Time seen: ----------------------------------------- 11:28 AM on 03/08/2015 -----------------------------------------    I have reviewed the triage vital signs and the nursing notes.   HISTORY  Chief Complaint Abdominal Pain; Nausea; and Back Pain    HPI Kathy Hardin is a 24 y.o. female who presents ER for nausea low-back pain, lower abdominal pain and mood swings. Patient states his symptoms going on for 3 months. Patient is not using birth control but doesn't think she could be pregnant, has a multitude of symptoms and was concerned she may need surgery.   Past Medical History  Diagnosis Date  . NVD (normal vaginal delivery) 10/31/2010    Patient Active Problem List   Diagnosis Date Noted  . NVD (normal vaginal delivery) 10/31/2010    History reviewed. No pertinent past surgical history.  Allergies Tylox and Aspirin  Social History Social History  Substance Use Topics  . Smoking status: Never Smoker   . Smokeless tobacco: None  . Alcohol Use: None    Review of Systems Constitutional: Negative for fever. Eyes: Negative for visual changes. ENT: Negative for sore throat. Cardiovascular: Negative for chest pain. Respiratory: Negative for shortness of breath. Gastrointestinal: Positive for abdominal pain, nausea vomiting Genitourinary: Negative for dysuria. Positive vaginal bleeding Musculoskeletal: Positive for low back pain Skin: Negative for rash. Neurological: Negative for headaches, focal weakness or numbness.  10-point ROS otherwise negative.  ____________________________________________   PHYSICAL EXAM:  VITAL SIGNS: ED Triage Vitals  Enc Vitals Group     BP 03/08/15 1121 129/83 mmHg     Pulse Rate 03/08/15 1121 60     Resp 03/08/15 1121 18     Temp 03/08/15 1121 97.4 F (36.3 C)     Temp Source 03/08/15 1121 Oral     SpO2 03/08/15 1121 99 %     Weight 03/08/15  1121 193 lb (87.544 kg)     Height --      Head Cir --      Peak Flow --      Pain Score 03/08/15 1121 5     Pain Loc --      Pain Edu? --      Excl. in [place]? --     Constitutional: Alert and oriented. Well appearing and in no distress. Eyes: Conjunctivae are normal. PERRL. Normal extraocular movements. ENT   Head: Normocephalic and atraumatic.   Nose: No congestion/rhinnorhea.   Mouth/Throat: Mucous membranes are moist.   Neck: No stridor. Cardiovascular: Normal rate, regular rhythm. Normal and symmetric distal pulses are present in all extremities. No murmurs, rubs, or gallops. Respiratory: Normal respiratory effort without tachypnea nor retractions. Breath sounds are clear and equal bilaterally. No wheezes/rales/rhonchi. Gastrointestinal: Soft and nontender. No distention. No abdominal bruits.  Musculoskeletal: Nontender with normal range of motion in all extremities. No joint effusions.  No lower extremity tenderness nor edema. Neurologic:  Normal speech and language. No gross focal neurologic deficits are appreciated. Speech is normal. No gait instability. Skin:  Skin is warm, dry and intact. No rash noted. Psychiatric: Mood and affect are normal. Speech and behavior are normal. Patient exhibits appropriate insight and judgment.  ____________________________________________  ED COURSE:  Pertinent labs & imaging results that were available during my care of the patient were reviewed by me and considered in my medical decision making (see chart for details). She is in no acute distress, will check basic labs and reevaluate. ____________________________________________    LABS (pertinent  positives/negatives)  Labs Reviewed  COMPREHENSIVE METABOLIC PANEL - Abnormal; Notable for the following:    Potassium 3.3 (*)    AST 13 (*)    ALT 10 (*)    All other components within normal limits  CBC - Abnormal; Notable for the following:    MCV 79.1 (*)    MCH 25.6 (*)     RDW 15.6 (*)    All other components within normal limits  URINALYSIS COMPLETEWITH MICROSCOPIC ([facility] ONLY) - Abnormal; Notable for the following:    Color, Urine YELLOW (*)    APPearance CLEAR (*)    Squamous Epithelial / LPF 0-5 (*)    All other components within normal limits  LIPASE, BLOOD  HCG, QUANTITATIVE, PREGNANCY  POC URINE PREG, ED  POCT PREGNANCY, URINE   ____________________________________________  FINAL ASSESSMENT AND PLAN  Medical screening exam, vaginal bleeding  Plan: Patient with labs and imaging as dictated above. Patient with abnormal vaginal bleeding and that she is now bled for about 10 days. I will offer Provera, she is stable for outpatient follow-up with her doctor. As to the reason for the rest of her symptoms, they are unclear.   Earleen Newport, MD   Earleen Newport, MD 03/08/15 1256

## 2015-03-08 NOTE — Discharge Instructions (Signed)

## 2015-03-08 NOTE — ED Notes (Signed)
Pt reports that she has been having nausea, lower abd pain, lower back pain, mood swings and hot flashes x 3 months.

## 2015-12-15 ENCOUNTER — Emergency Department: Payer: Self-pay

## 2015-12-15 ENCOUNTER — Emergency Department
Admission: EM | Admit: 2015-12-15 | Discharge: 2015-12-15 | Disposition: A | Discharge status: Home / Self Care | Payer: Self-pay | Attending: Emergency Medicine | Admitting: Emergency Medicine

## 2015-12-15 ENCOUNTER — Encounter: Payer: Self-pay | Admitting: *Deleted

## 2015-12-15 DIAGNOSIS — D259 Leiomyoma of uterus, unspecified: Secondary | ICD-10-CM | POA: Insufficient documentation

## 2015-12-15 DIAGNOSIS — N939 Abnormal uterine and vaginal bleeding, unspecified: Secondary | ICD-10-CM

## 2015-12-15 DIAGNOSIS — Z79899 Other long term (current) drug therapy: Secondary | ICD-10-CM | POA: Insufficient documentation

## 2015-12-15 LAB — CBC WITH DIFFERENTIAL/PLATELET
BASOS ABS: 0 10*3/uL (ref 0–0.1)
BASOS PCT: 1 %
EOS PCT: 3 %
Eosinophils Absolute: 0.2 10*3/uL (ref 0–0.7)
HEMATOCRIT: 36.7 % (ref 35.0–47.0)
Hemoglobin: 12.6 g/dL (ref 12.0–16.0)
LYMPHS PCT: 20 %
Lymphs Abs: 1.7 10*3/uL (ref 1.0–3.6)
MCH: 27.1 pg (ref 26.0–34.0)
MCHC: 34.5 g/dL (ref 32.0–36.0)
MCV: 78.5 fL — AB (ref 80.0–100.0)
MONO ABS: 0.4 10*3/uL (ref 0.2–0.9)
MONOS PCT: 5 %
NEUTROS ABS: 5.9 10*3/uL (ref 1.4–6.5)
Neutrophils Relative %: 71 %
PLATELETS: 248 10*3/uL (ref 150–440)
RBC: 4.67 MIL/uL (ref 3.80–5.20)
RDW: 15.2 % — AB (ref 11.5–14.5)
WBC: 8.3 10*3/uL (ref 3.6–11.0)

## 2015-12-15 LAB — BASIC METABOLIC PANEL
Anion gap: 4 — ABNORMAL LOW (ref 5–15)
BUN: 16 mg/dL (ref 6–20)
CALCIUM: 9 mg/dL (ref 8.9–10.3)
CO2: 26 mmol/L (ref 22–32)
CREATININE: 0.88 mg/dL (ref 0.44–1.00)
Chloride: 110 mmol/L (ref 101–111)
GFR calc Af Amer: >60 mL/min (ref >60)
GLUCOSE: 94 mg/dL (ref 65–99)
Potassium: 3.1 mmol/L — ABNORMAL LOW (ref 3.5–5.1)
Sodium: 140 mmol/L (ref 135–145)

## 2015-12-15 LAB — TSH: TSH: 0.533 u[IU]/mL (ref 0.350–4.500)

## 2015-12-15 LAB — POCT PREGNANCY, URINE: Preg Test, Ur: NEGATIVE

## 2015-12-15 NOTE — Discharge Instructions (Signed)
Please seek medical attention for any high fevers, chest pain, shortness of breath, change in behavior, persistent vomiting, bloody stool or any other new or concerning symptoms.  

## 2015-12-15 NOTE — ED Notes (Signed)
Patient states that she started on the depo shot in May, since then she has had vaginal bleeding. Patient states that she goes through 3-4 pads per day. Patient is passing clots and lower abdominal cramps. Patient states that she has had decreased appetite and has not eaten in 3 days.

## 2015-12-15 NOTE — ED Triage Notes (Signed)
Pt states vaginal bleeding for 3 months, states weight loss, states she was given estrogen from health dept, states feeling weak

## 2015-12-15 NOTE — ED Provider Notes (Signed)
[facility] Emergency Department Provider Note    ____________________________________________   I have reviewed the triage vital signs and the nursing notes.   HISTORY  Chief Complaint Vaginal Bleeding   History limited by: Not Limited   HPI Kathy Hardin is a 25 y.o. female who presents to the emergency department today with primary complain of vaginal bleeding. This has been going on for four months. She has had some passage of blood clots recently. The patient states that she has had some cramping with the bleeding. She went to the health department 2 weeks ago and was prescribed oral contraceptives but has not appreciated any change in her bleeding. In addition the patient has felt somewhat weak and has lost weight over this time. No fevers. No chest pain. No shortness of breath.   Past Medical History:  Diagnosis Date  . NVD (normal vaginal delivery) 10/31/2010    Patient Active Problem List   Diagnosis Date Noted  . NVD (normal vaginal delivery) 10/31/2010    History reviewed. No pertinent surgical history.  Prior to Admission medications   Medication Sig Start Date End Date Taking? Authorizing Provider  prenatal vitamin w/FE, FA (PRENATAL 1 + 1) 27-1 MG TABS Take 1 tablet by mouth daily.      Historical Provider, MD  promethazine (PHENERGAN) 25 MG tablet Take 1 tablet (25 mg total) by mouth every 6 (six) hours as needed for nausea or vomiting. 03/08/15   Earleen Newport, MD    Allergies Tylox [oxycodone-acetaminophen] and Aspirin  History reviewed. No pertinent family history.  Social History Social History  Substance Use Topics  . Smoking status: Never Smoker  . Smokeless tobacco: Not on file  . Alcohol use Not on file    Review of Systems  Constitutional: Negative for fever. Cardiovascular: Negative for chest pain. Respiratory: Negative for shortness of breath. Gastrointestinal: Negative for abdominal pain, vomiting and  diarrhea. Genitourinary: Negative for dysuria. Musculoskeletal: Negative for back pain. Skin: Negative for rash. Neurological: Negative for headaches, focal weakness or numbness. }  10-point ROS otherwise negative.  ____________________________________________   PHYSICAL EXAM:  VITAL SIGNS: ED Triage Vitals [12/15/15 0918]  Enc Vitals Group     BP (!) 127/94     Pulse Rate 82     Resp 18     Temp 98 F (36.7 C)     Temp Source Oral     SpO2 97 %     Weight 148 lb (67.1 kg)     Height 5\' 5"  (1.651 m)   Constitutional: Alert and oriented. Well appearing and in no distress. Eyes: Conjunctivae are normal. Normal extraocular movements. ENT   Head: Normocephalic and atraumatic.   Nose: No congestion/rhinnorhea.   Mouth/Throat: Mucous membranes are moist.   Neck: No stridor. Hematological/Lymphatic/Immunilogical: No cervical lymphadenopathy. Cardiovascular: Normal rate, regular rhythm.  No murmurs, rubs, or gallops. Respiratory: Normal respiratory effort without tachypnea nor retractions. Breath sounds are clear and equal bilaterally. No wheezes/rales/rhonchi. Gastrointestinal: Soft and nontender. No distention.  Genitourinary: Deferred Musculoskeletal: Normal range of motion in all extremities. No lower extremity edema. Neurologic:  Normal speech and language. No gross focal neurologic deficits are appreciated.  Skin:  Skin is warm, dry and intact. No rash noted. Psychiatric: Mood and affect are normal. Speech and behavior are normal. Patient exhibits appropriate insight and judgment.  ____________________________________________    LABS (pertinent positives/negatives)  Labs Reviewed  CBC WITH DIFFERENTIAL/PLATELET - Abnormal; Notable for the following:  Result Value   MCV 78.5 (*)    RDW 15.2 (*)    All other components within normal limits  BASIC METABOLIC PANEL - Abnormal; Notable for the following:    Potassium 3.1 (*)    Anion gap 4 (*)    All  other components within normal limits  TSH  POCT PREGNANCY, URINE    ____________________________________________   EKG  None  ____________________________________________    RADIOLOGY  US IMPRESSION:  Small uterine fibroid is noted. No other abnormality seen in the  pelvis.      ____________________________________________   PROCEDURES  Procedures  ____________________________________________   INITIAL IMPRESSION / ASSESSMENT AND PLAN / ED COURSE  Pertinent labs & imaging results that were available during my care of the patient were reviewed by me and considered in my medical decision making (see chart for details).  Patient presented to the emergency department today because of concerns for vaginal bleeding for 4 months. Hemoglobin within normal limits. Ultrasound without any concerning findings are very small uterine fibroid. At this point think patient is safe for outpatient follow up. Will give ob/gyn contact information. ____________________________________________   FINAL CLINICAL IMPRESSION(S) / ED DIAGNOSES  Final diagnoses:  Vaginal bleeding  Uterine leiomyoma, unspecified location     Note: This dictation was prepared with Dragon dictation. Any transcriptional errors that result from this process are unintentional    Nance Pear, MD 12/15/15 1321

## 2016-03-28 ENCOUNTER — Emergency Department
Admission: EM | Admit: 2016-03-28 | Discharge: 2016-03-28 | Disposition: A | Discharge status: Home / Self Care | Payer: Medicaid Other | Attending: Emergency Medicine | Admitting: Emergency Medicine

## 2016-03-28 ENCOUNTER — Encounter: Payer: Self-pay | Admitting: Emergency Medicine

## 2016-03-28 DIAGNOSIS — Z79899 Other long term (current) drug therapy: Secondary | ICD-10-CM | POA: Insufficient documentation

## 2016-03-28 DIAGNOSIS — J029 Acute pharyngitis, unspecified: Secondary | ICD-10-CM

## 2016-03-28 DIAGNOSIS — M436 Torticollis: Secondary | ICD-10-CM | POA: Insufficient documentation

## 2016-03-28 LAB — POCT RAPID STREP A: STREPTOCOCCUS, GROUP A SCREEN (DIRECT): NEGATIVE

## 2016-03-28 MED ORDER — CYCLOBENZAPRINE HCL 10 MG PO TABS: 10.0000 mg | ORAL_TABLET | Freq: Three times a day (TID) | ORAL | 0 refills | Status: DC | PRN

## 2016-03-28 MED ORDER — TRAMADOL HCL 50 MG PO TABS: 50.0000 mg | ORAL_TABLET | Freq: Four times a day (QID) | ORAL | 0 refills | Status: DC | PRN

## 2016-03-28 MED ORDER — PREDNISONE 10 MG (21) PO TBPK: ORAL_TABLET | ORAL | 0 refills | Status: DC

## 2016-03-28 NOTE — ED Notes (Signed)

## 2016-03-28 NOTE — ED Triage Notes (Signed)
Sore throat x 2 days

## 2016-03-28 NOTE — ED Provider Notes (Signed)
[facility] Emergency Department Provider Note  ____________________________________________  Time seen: Approximately 10:00 AM  I have reviewed the triage vital signs and the nursing notes.   HISTORY  Chief Complaint Sore Throat    HPI Kathy Hardin is a 25 y.o. female who presents to the emergency department for evaluation of sore throat and pain in the left side of her neck. She states that a couple days ago she slept "funny" and has had pain in her neck since that time.She also states that when she awakened that morning, her throat was very sore and it has progressively worsened and is now causing pain in her left ear. She has had no relief with ibuprofen.  Past Medical History:  Diagnosis Date  . NVD (normal vaginal delivery) 10/31/2010    Patient Active Problem List   Diagnosis Date Noted  . NVD (normal vaginal delivery) 10/31/2010    History reviewed. No pertinent surgical history.  Prior to Admission medications   Medication Sig Start Date End Date Taking? Authorizing Provider  cyclobenzaprine (FLEXERIL) 10 MG tablet Take 1 tablet (10 mg total) by mouth 3 (three) times daily as needed for muscle spasms. 03/28/16   Victorino Dike, FNP  estrogens conjugated, synthetic A, (CENESTIN) 1.25 MG tablet Take 1.25 mg by mouth every morning.    Historical Provider, MD  predniSONE (STERAPRED UNI-PAK 21 TAB) 10 MG (21) TBPK tablet Take 6 tablets on day 1 Take 5 tablets on day 2 Take 4 tablets on day 3 Take 3 tablets on day 4 Take 2 tablets on day 5 Take 1 tablet on day 6 03/28/16   Jackee Glasner B Evanie Buckle, FNP  promethazine (PHENERGAN) 25 MG tablet Take 1 tablet (25 mg total) by mouth every 6 (six) hours as needed for nausea or vomiting. 03/08/15   Earleen Newport, MD  traMADol (ULTRAM) 50 MG tablet Take 1 tablet (50 mg total) by mouth every 6 (six) hours as needed. 03/28/16   Victorino Dike, FNP    Allergies Aspirin and Oxycodone  No family history on  file.  Social History Social History  Substance Use Topics  . Smoking status: Never Smoker  . Smokeless tobacco: Not on file  . Alcohol use Not on file    Review of Systems Constitutional: Negative for fever. Eyes: No visual changes. ENT: Positive for sore throat; positive for difficulty swallowing due to pain. Respiratory: Denies shortness of breath. Gastrointestinal: No abdominal pain.  No nausea, no vomiting.  No diarrhea.  Genitourinary: Negative for dysuria. Musculoskeletal: Negative for generalized body aches. Positive for left neck pain Skin: Negative for rash. Neurological: Negative for headaches, focal weakness or numbness.  ____________________________________________   PHYSICAL EXAM:  VITAL SIGNS: ED Triage Vitals  Enc Vitals Group     BP 03/28/16 0934 120/61     Pulse Rate 03/28/16 0934 92     Resp 03/28/16 0934 18     Temp 03/28/16 0934 99.4 F (37.4 C)     Temp Source 03/28/16 0934 Oral     SpO2 03/28/16 0934 100 %     Weight 03/28/16 0936 150 lb (68 kg)     Height 03/28/16 0936 5\' 5"  (1.651 m)     Head Circumference --      Peak Flow --      Pain Score 03/28/16 0936 10     Pain Loc --      Pain Edu? --      Excl. in [place]? --  Constitutional: Alert and oriented. Well appearing and in no acute distress. Eyes: Conjunctivae are normal. PERRL. EOMI. Head: Atraumatic. Nose: No congestion/rhinnorhea. Mouth/Throat: Mucous membranes are moist.  Oropharynx Erythematous, without tonsillar exudate.  Neck: No stridor.  Lymphatic: Lymphadenopathy: None Cardiovascular: Normal rate, regular rhythm. Good peripheral circulation. Respiratory: Normal respiratory effort. Lungs CTAB. Gastrointestinal: Soft and nontender. Musculoskeletal: No lower extremity tenderness nor edema. Tender to palpation over the sternocleidomastoid muscle on the left that extends down into the trapezius. Neurologic:  Normal speech and language. No gross focal neurologic deficits are  appreciated. Speech is normal. No gait instability. Skin:  Skin is warm, dry and intact. No rash noted Psychiatric: Mood and affect are normal. Speech and behavior are normal.   ____________________________________________   LABS (all labs ordered are listed, but only abnormal results are displayed)  Labs Reviewed  POCT RAPID STREP A   ____________________________________________  EKG  Not indicated ____________________________________________  RADIOLOGY  Not indicated. ____________________________________________   PROCEDURES  Procedure(s) performed: None  Critical Care performed: No  ____________________________________________   INITIAL IMPRESSION / ASSESSMENT AND PLAN / ED COURSE  Clinical Course     Pertinent labs & imaging results that were available during my care of the patient were reviewed by me and considered in my medical decision making (see chart for details).  25 year old female presenting for evaluation of sore throat and neck pain. Strep screen negative. Will treat with prednisone taper and flexeril. She was advised to follow up with the PCP of her choice or return to the ER for symptoms that change or worsen if unable to schedule an appointment. ____________________________________________   FINAL CLINICAL IMPRESSION(S) / ED DIAGNOSES  Final diagnoses:  Acute pharyngitis, unspecified etiology  Torticollis, acute    Note:  This document was prepared using Dragon voice recognition software and may include unintentional dictation errors.    Victorino Dike, FNP 03/28/16 1115    Carrie Mew, MD 04/02/16 [phone]

## 2016-08-29 IMAGING — US US OB LIMITED
1 series · 14 of 24 positions shown · non-contrast
Comparison: none

CLINICAL DATA: Abdominal pain. Leaking fluid. No prenatal care.
Quantitative beta HCG is [DATE].

EXAM:
LIMITED OBSTETRIC ULTRASOUND

[Series 1: us ob limited · 0.24mm/px · 14 of 24 slices shown]
[im 1/24]
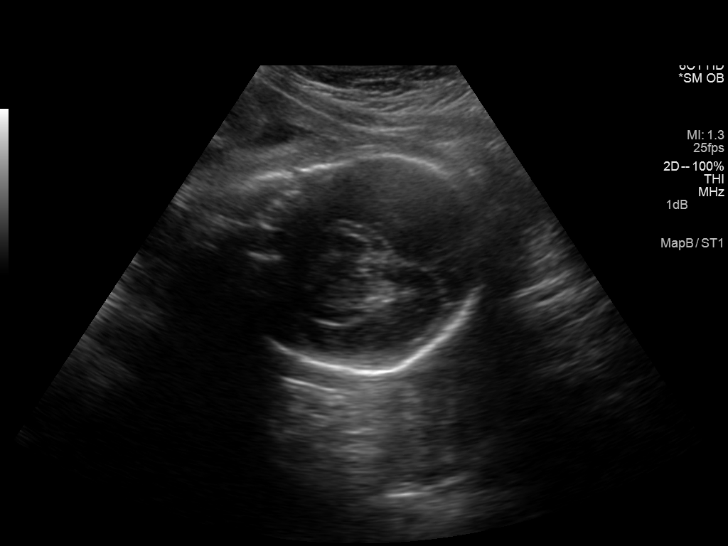
[im 3/24]
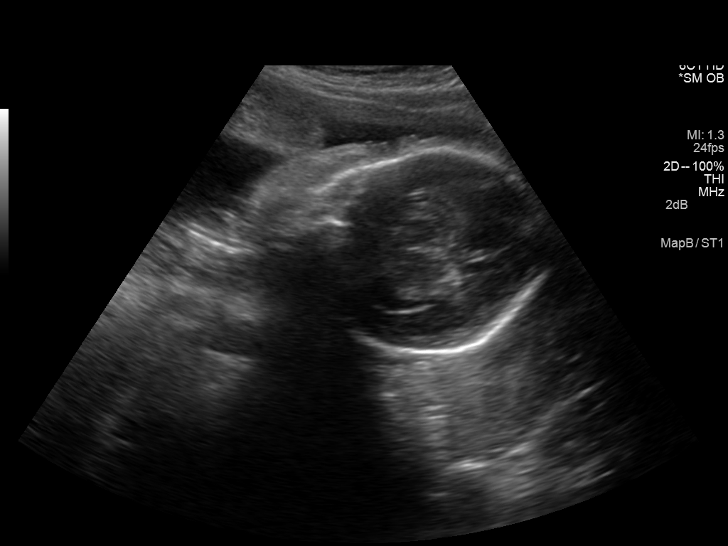
[im 5/24]
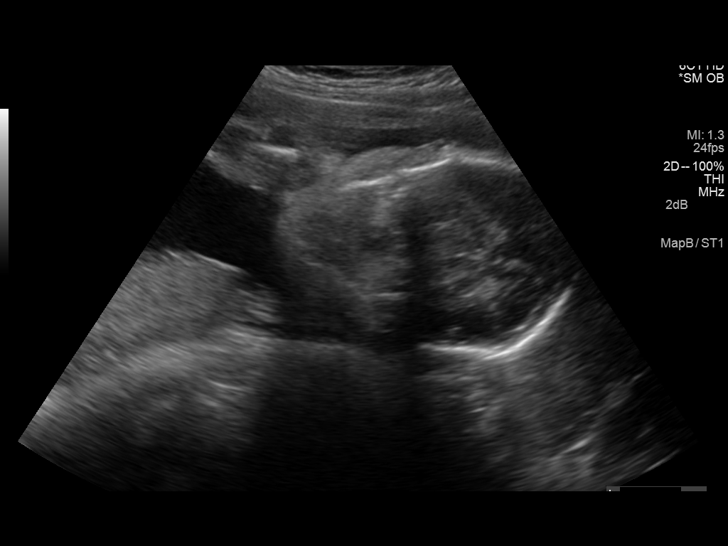
[im 7/24]
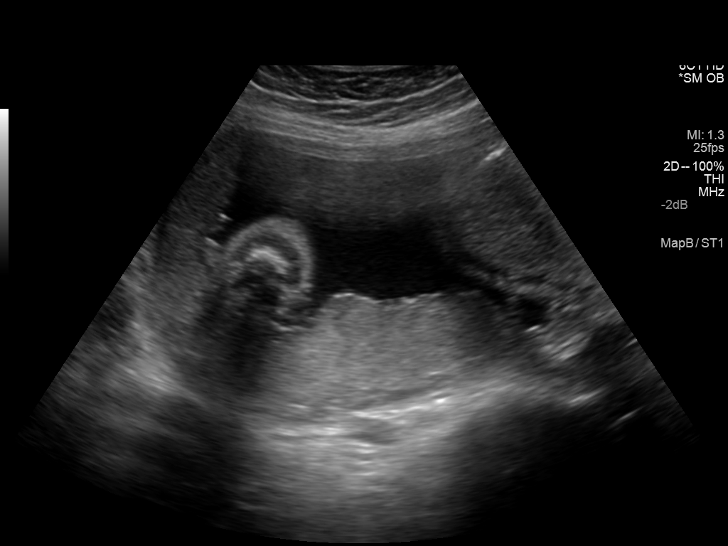
[im 8/24]
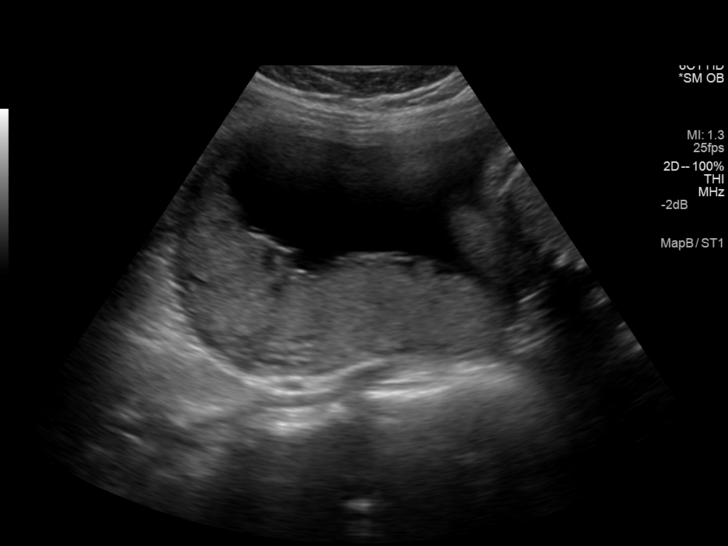
[im 10/24]
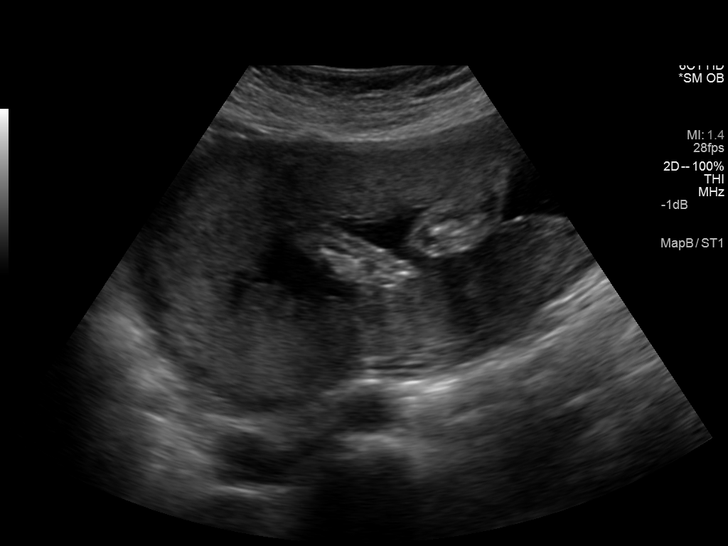
[im 12/24]
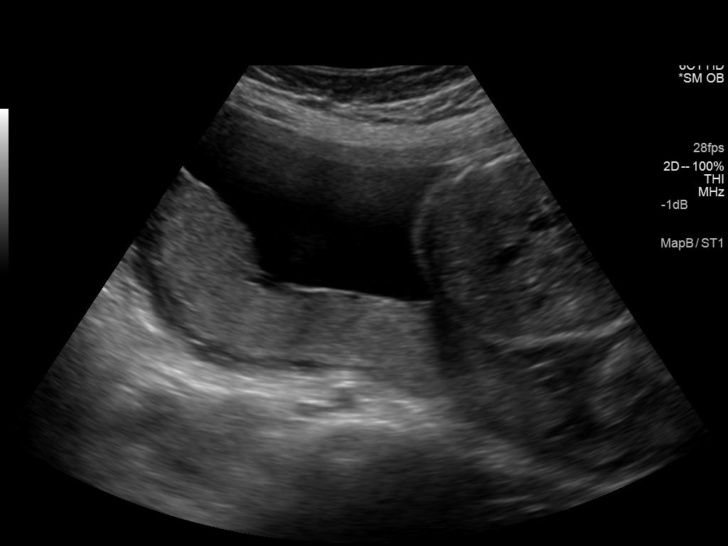
[im 13/24]
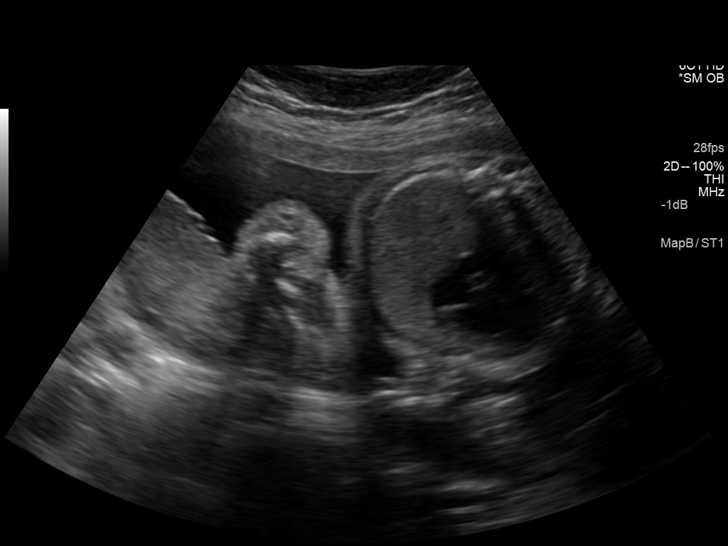
[im 15/24]
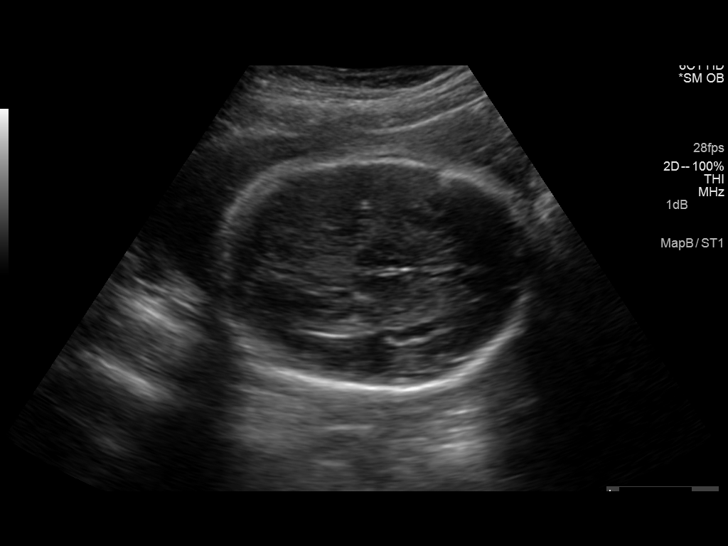
[im 17/24]
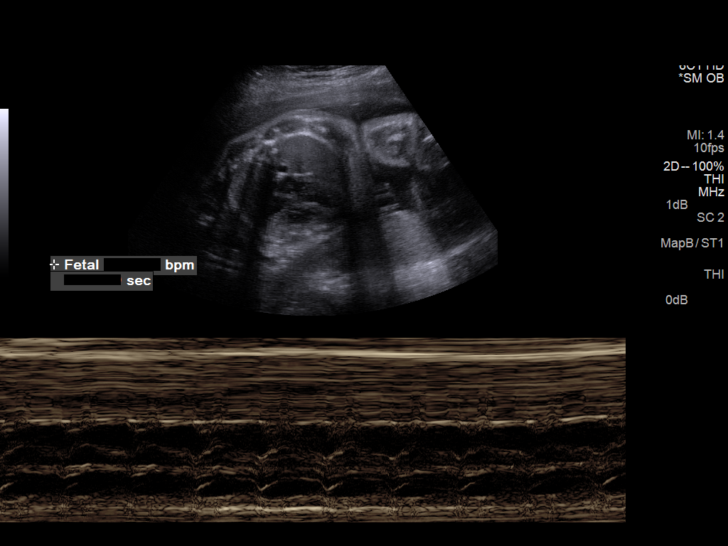
[im 19/24]
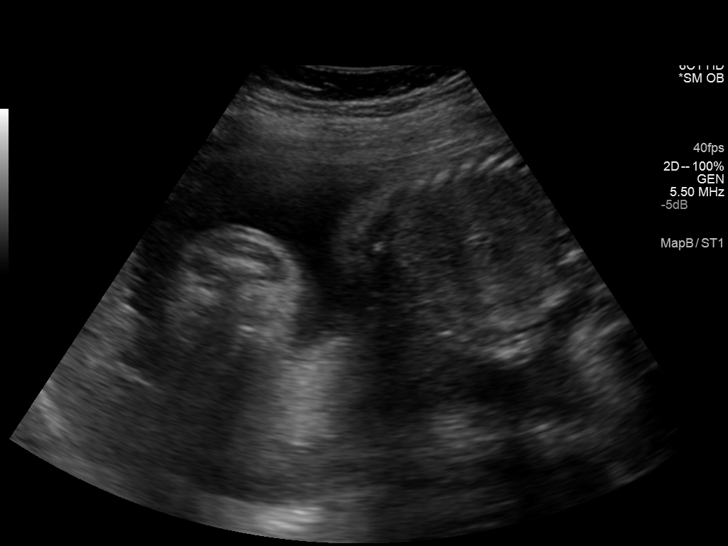
[im 20/24]
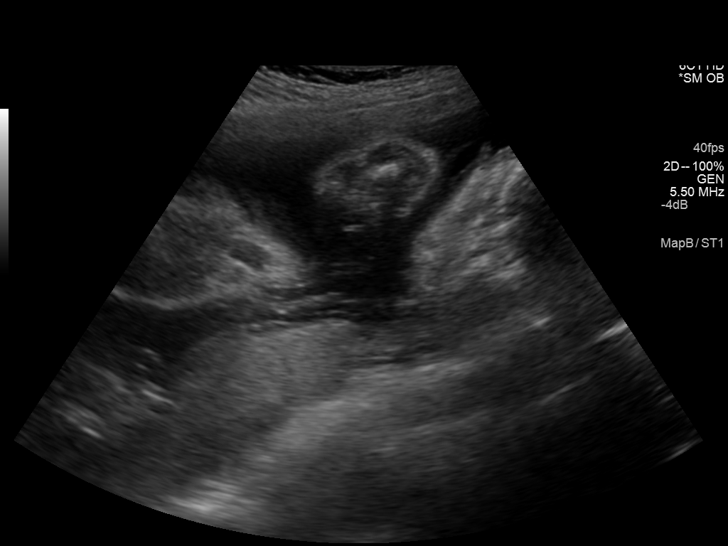
[im 22/24]
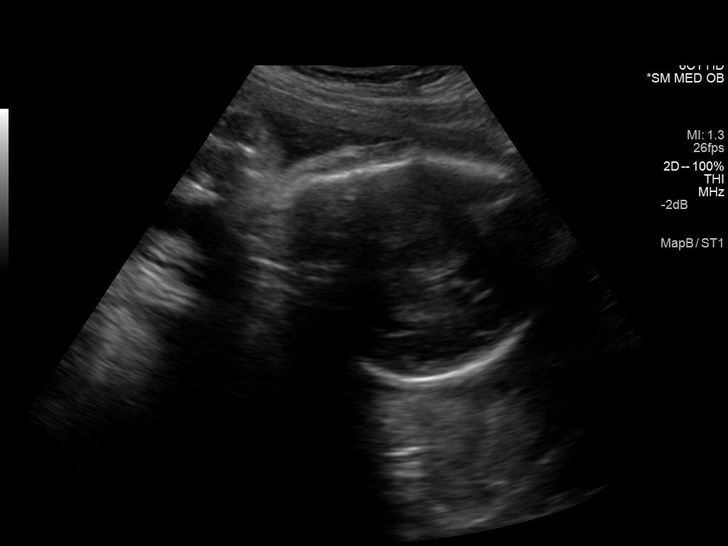
[im 24/24]
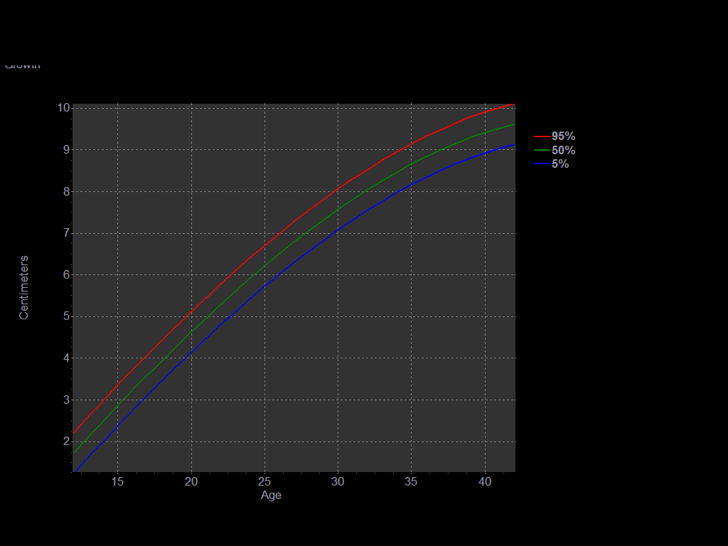

[14 of 24 positions shown; findings below may reference images not displayed]

FINDINGS: Number of Fetuses: 1

Heart Rate:  158 bpm

Movement: Yes

Presentation: Cephalic

Placental Location: Fundal and posterior

Previa: None

Amniotic Fluid (Subjective):  Normal

BPD:  6.8cm 27w  2d

MATERNAL FINDINGS:

Cervix:  Closed, 3.3 cm

Uterus/Adnexae:  Ovaries are not imaged.
IMPRESSION: 1. Single living intrauterine fetus in cephalic presentation.
2. Limited biometry suggests gestational age of approximately 27
weeks and 2 days.
3. Subjectively amniotic fluid volume is normal.
4. Cervix appears closed.
5. This exam is performed on an emergent basis and does not
comprehensively evaluate fetal size, dating, or anatomy; follow-up
complete OB US is recommended.

## 2017-05-12 ENCOUNTER — Other Ambulatory Visit: Payer: Self-pay

## 2017-05-12 ENCOUNTER — Emergency Department
Admission: EM | Admit: 2017-05-12 | Discharge: 2017-05-12 | Disposition: A | Discharge status: Home / Self Care | Payer: Self-pay | Attending: Internal Medicine | Admitting: Internal Medicine

## 2017-05-12 ENCOUNTER — Encounter: Payer: Self-pay | Admitting: *Deleted

## 2017-05-12 ENCOUNTER — Emergency Department: Payer: Self-pay

## 2017-05-12 DIAGNOSIS — Z3A01 Less than 8 weeks gestation of pregnancy: Secondary | ICD-10-CM | POA: Insufficient documentation

## 2017-05-12 DIAGNOSIS — O208 Other hemorrhage in early pregnancy: Secondary | ICD-10-CM | POA: Insufficient documentation

## 2017-05-12 DIAGNOSIS — O418X10 Other specified disorders of amniotic fluid and membranes, first trimester, not applicable or unspecified: Secondary | ICD-10-CM

## 2017-05-12 DIAGNOSIS — Z79899 Other long term (current) drug therapy: Secondary | ICD-10-CM | POA: Insufficient documentation

## 2017-05-12 DIAGNOSIS — O4690 Antepartum hemorrhage, unspecified, unspecified trimester: Secondary | ICD-10-CM

## 2017-05-12 DIAGNOSIS — O468X1 Other antepartum hemorrhage, first trimester: Secondary | ICD-10-CM

## 2017-05-12 LAB — URINALYSIS, COMPLETE (UACMP) WITH MICROSCOPIC
Bacteria, UA: NONE SEEN
Bilirubin Urine: NEGATIVE
Glucose, UA: NEGATIVE mg/dL
Hgb urine dipstick: NEGATIVE
KETONES UR: NEGATIVE mg/dL
Leukocytes, UA: NEGATIVE
Nitrite: NEGATIVE
Protein, ur: NEGATIVE mg/dL
SPECIFIC GRAVITY, URINE: 1.01 (ref 1.005–1.030)
pH: 5 (ref 5.0–8.0)

## 2017-05-12 LAB — HCG, QUANTITATIVE, PREGNANCY: HCG, BETA CHAIN, QUANT, S: 165245 m[IU]/mL — AB (ref <5)

## 2017-05-12 LAB — ABO/RH: ABO/RH(D): O POS

## 2017-05-12 LAB — POCT PREGNANCY, URINE: PREG TEST UR: POSITIVE — AB

## 2017-05-12 NOTE — ED Provider Notes (Addendum)
[facility] Emergency Department Provider Note ____________________________________________  Time seen: 1226  I have reviewed the triage vital signs and the nursing notes.  HISTORY  Chief Complaint  Vaginal Bleeding  HPI Kathy Hardin is a 27 y.o. female G3P3 presents to the ED for evaluation of vaginal bleeding.  Patient describes missing her period in January and then noting some slight spotting and light-pink bleeding 3 days prior.  She also notes some mild low back pain.  She is on any birth control currently.  She denies any vaginal discharge, nausea, vomiting, dizziness patient denies any fevers, chills, sweats.  Past Medical History:  Diagnosis Date  . NVD (normal vaginal delivery) 10/31/2010    Patient Active Problem List   Diagnosis Date Noted  . NVD (normal vaginal delivery) 10/31/2010    History reviewed. No pertinent surgical history.  Prior to Admission medications   Medication Sig Start Date End Date Taking? Authorizing Provider  cyclobenzaprine (FLEXERIL) 10 MG tablet Take 1 tablet (10 mg total) by mouth 3 (three) times daily as needed for muscle spasms. 03/28/16   Triplett, Johnette Abraham B, FNP  estrogens conjugated, synthetic A, (CENESTIN) 1.25 MG tablet Take 1.25 mg by mouth every morning.    [provider]  predniSONE (STERAPRED UNI-PAK 21 TAB) 10 MG (21) TBPK tablet Take 6 tablets on day 1 Take 5 tablets on day 2 Take 4 tablets on day 3 Take 3 tablets on day 4 Take 2 tablets on day 5 Take 1 tablet on day 6 03/28/16   Triplett, Cari B, FNP  promethazine (PHENERGAN) 25 MG tablet Take 1 tablet (25 mg total) by mouth every 6 (six) hours as needed for nausea or vomiting. 03/08/15   Earleen Newport, MD  traMADol (ULTRAM) 50 MG tablet Take 1 tablet (50 mg total) by mouth every 6 (six) hours as needed. 03/28/16   Triplett, Johnette Abraham B, FNP    Allergies Aspirin and Oxycodone  No family history on file.  Social History Social History    Tobacco Use  . Smoking status: Never Smoker  . Smokeless tobacco: Never Used  Substance Use Topics  . Alcohol use: No    Frequency: Never  . Drug use: No    Review of Systems  Constitutional: Negative for fever. Cardiovascular: Negative for chest pain. Respiratory: Negative for shortness of breath. Gastrointestinal: Negative for abdominal pain, vomiting and diarrhea. Genitourinary: Negative for dysuria.  Bleeding as above. Musculoskeletal: Negative for back pain. Skin: Negative for rash. Neurological: Negative for headaches, focal weakness or numbness. ____________________________________________  PHYSICAL EXAM:  VITAL SIGNS: ED Triage Vitals  Enc Vitals Group     BP 05/12/17 1137 (!) 98/59     Pulse Rate 05/12/17 1137 89     Resp 05/12/17 1137 18     Temp 05/12/17 1137 98.6 F (37 C)     Temp Source 05/12/17 1137 Oral     SpO2 05/12/17 1137 100 %     Weight 05/12/17 1141 150 lb (68 kg)     Height 05/12/17 1141 5\' 5"  (1.651 m)     Head Circumference --      Peak Flow --      Pain Score 05/12/17 1138 7     Pain Loc --      Pain Edu? --      Excl. in [place]? --     Constitutional: Alert and oriented. Well appearing and in no distress. Head: Normocephalic and atraumatic. Cardiovascular: Normal rate, regular rhythm. Normal  distal pulses. Respiratory: Normal respiratory effort. No wheezes/rales/rhonchi. Gastrointestinal: Soft and nontender. No distention. GU: deferred Musculoskeletal: Nontender with normal range of motion in all extremities.  Neurologic:  Normal gait without ataxia. Normal speech and language. No gross focal neurologic deficits are appreciated. Skin:  Skin is warm, dry and intact. No rash noted. Psychiatric: Mood and affect are normal. Patient exhibits appropriate insight and judgment. ____________________________________________   LABS (pertinent positives/negatives)  Labs Reviewed  HCG, QUANTITATIVE, PREGNANCY - Abnormal; Notable for the  following components:      Result Value   hCG, Beta Chain, Quant, S 165,245 (*)    All other components within normal limits  URINALYSIS, COMPLETE (UACMP) WITH MICROSCOPIC - Abnormal; Notable for the following components:   Color, Urine YELLOW (*)    APPearance CLEAR (*)    Squamous Epithelial / LPF 0-5 (*)    All other components within normal limits  POCT PREGNANCY, URINE - Abnormal; Notable for the following components:   Preg Test, Ur POSITIVE (*)    All other components within normal limits  POC URINE PREG, ED  ABO/RH  ____________________________________________   RADIOLOGY  Ultrasound OB <14 weeks  IMPRESSION: Single live intrauterine gestation with estimated gestational age of [redacted] weeks. Small subchorionic hemorrhage noted. ____________________________________________  INITIAL IMPRESSION / ASSESSMENT AND PLAN / ED COURSE  DDX: IUP, ectopic pregnancy, ovarian cyst, threatened Ab  Patient with a ED evaluation scant vaginal bleeding over the last 3 days.  Patient has reportedly missed her period for January.  She presents today with an IUP confirmed by ultrasound.  Her exam is otherwise benign and ultrasound shows a closed cervical os with a small subchorionic hemorrhage.  Patient is discharged with instructions to follow-up  [facility] for further evaluation management.  She denies any vaginal discharge and has deferred any further screening to the health department at this time.  Return precautions have been reviewed. ____________________________________________  FINAL CLINICAL IMPRESSION(S) / ED DIAGNOSES  Final diagnoses:  Vaginal bleeding in pregnancy  Subchorionic hematoma in first trimester, single or unspecified fetus      Melvenia Needles, PA-C 05/12/17 1533    Earleen Newport, MD 05/12/17 [address] Dannielle Karvonen, PA-C 05/12/17 1820    Earleen Newport, MD 05/17/17 2041

## 2017-05-12 NOTE — ED Triage Notes (Signed)
Per patient's report, she missed her period in January and began having vaginal bleeding 3 days ago. Patient reports flow has increased in the 3 days, but still is not considered a heavy flow. Patient c/o low back pain. Patient denies being on birth control.

## 2017-05-12 NOTE — Discharge Instructions (Signed)
Your labs and ultrasound reveal a single pregnancy within the uterus. There is no evidence of an ectopic pregnancy. Bleeding can be normal in pregnancy. You have a small collection of bleeding within the uterus. It appears stable, and will likely resolve. Continue to monitor for any worsening or pain or bleeding. Follow-up with the [facility] department for further testing and prenatal care. Return to the ED as needed.

## 2018-01-12 ENCOUNTER — Emergency Department
Admission: EM | Admit: 2018-01-12 | Discharge: 2018-01-12 | Disposition: A | Discharge status: Home / Self Care | Payer: Self-pay | Attending: Emergency Medicine | Admitting: Emergency Medicine

## 2018-01-12 ENCOUNTER — Other Ambulatory Visit: Payer: Self-pay

## 2018-01-12 ENCOUNTER — Encounter: Payer: Self-pay | Admitting: Emergency Medicine

## 2018-01-12 DIAGNOSIS — H9192 Unspecified hearing loss, left ear: Secondary | ICD-10-CM | POA: Insufficient documentation

## 2018-01-12 DIAGNOSIS — H66002 Acute suppurative otitis media without spontaneous rupture of ear drum, left ear: Secondary | ICD-10-CM | POA: Insufficient documentation

## 2018-01-12 DIAGNOSIS — Z79899 Other long term (current) drug therapy: Secondary | ICD-10-CM | POA: Insufficient documentation

## 2018-01-12 DIAGNOSIS — H7292 Unspecified perforation of tympanic membrane, left ear: Secondary | ICD-10-CM | POA: Insufficient documentation

## 2018-01-12 MED ORDER — CIPROFLOXACIN HCL 500 MG PO TABS: 500.0000 mg | ORAL_TABLET | Freq: Two times a day (BID) | ORAL | 0 refills | Status: AC

## 2018-01-12 MED ORDER — CIPROFLOXACIN-HYDROCORTISONE 0.2-1 % OT SUSP: 4.0000 [drp] | Freq: Two times a day (BID) | OTIC | 0 refills | Status: AC

## 2018-01-12 MED ORDER — AMOXICILLIN 500 MG PO CAPS: 500.0000 mg | ORAL_CAPSULE | Freq: Three times a day (TID) | ORAL | 0 refills | Status: DC

## 2018-01-12 NOTE — Discharge Instructions (Signed)
You are being treated for a traumatic ear drum rupture. Use the ear drops as directed. Take the antibiotic medicine as directed. You MUST avoid any water getting into your ear, until the ear drum has been confirmed to be completely healed. Use cotton balls to keep the ear canal clear. Call Dr. Richardson Landry to follow-up as discussed.
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
## 2018-02-19 IMAGING — US US PELVIS COMPLETE
1 series · 14 of 25 positions shown · non-contrast
Comparison: Ultrasound December 30, 2013.

CLINICAL DATA: Vaginal bleeding for 3 months.

EXAM:
TRANSABDOMINAL AND TRANSVAGINAL ULTRASOUND OF PELVIS
TECHNIQUE: Both transabdominal and transvaginal ultrasound examinations of the
pelvis were performed. Transabdominal technique was performed for
global imaging of the pelvis including uterus, ovaries, adnexal
regions, and pelvic cul-de-sac. It was necessary to proceed with
endovaginal exam following the transabdominal exam to visualize the
endometrium and ovaries.

[Series 1: us pelvis complete · 0.20mm/px · 14 of 79 slices shown]
[im 1/79]
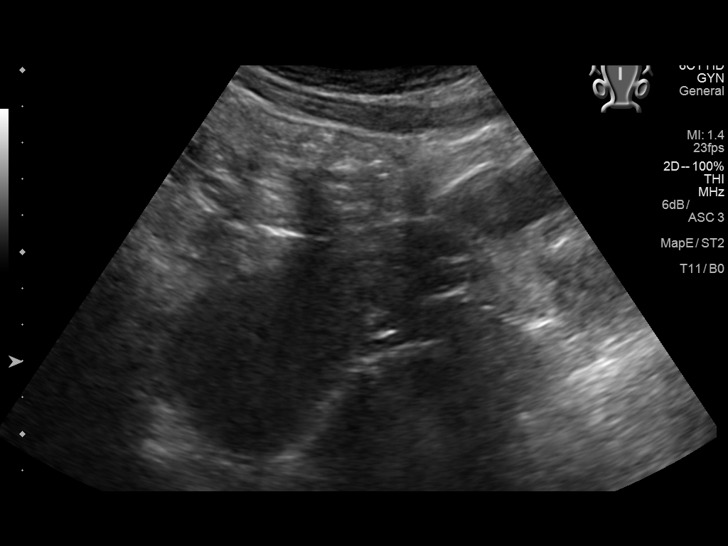
[im 7/79]
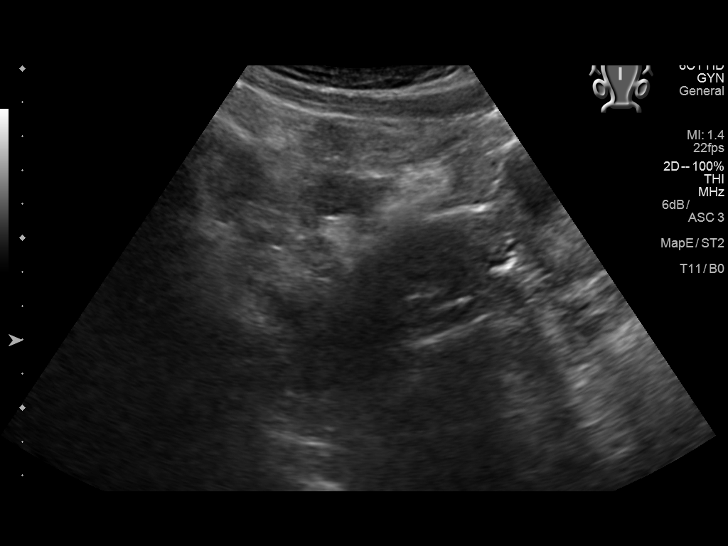
[im 14/79]
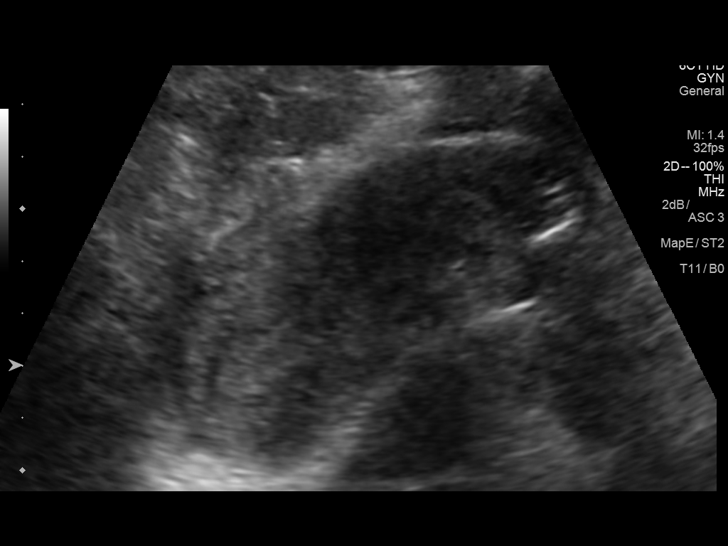
[im 20/79]
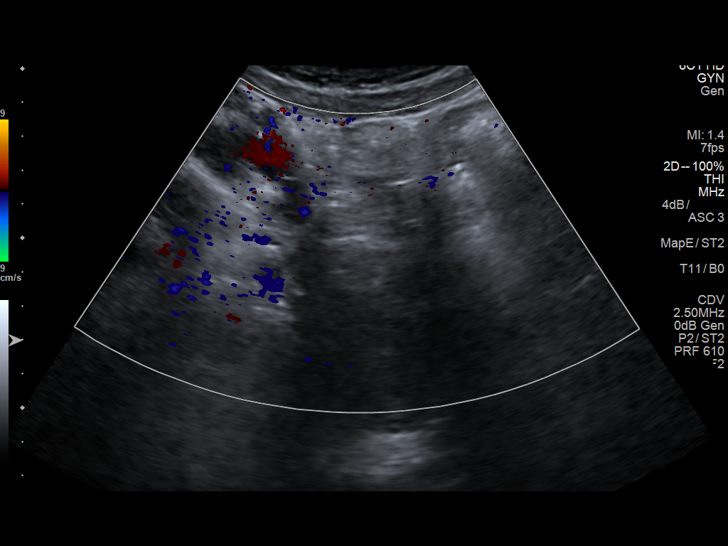
[im 27/79]
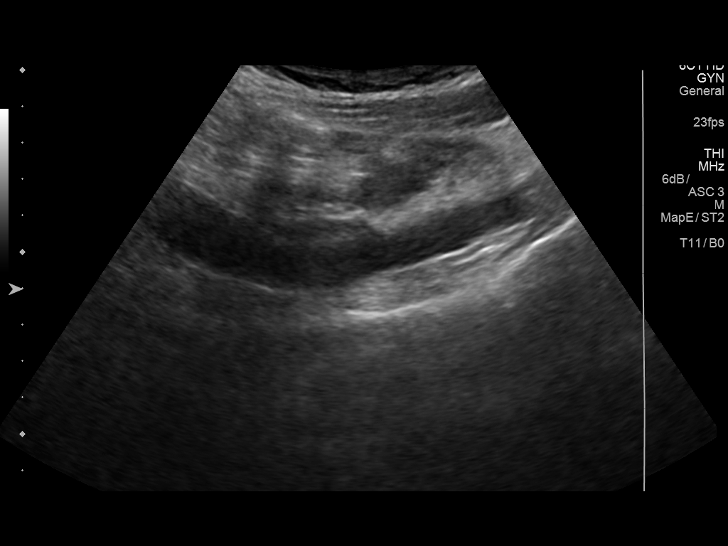
[im 30/79]
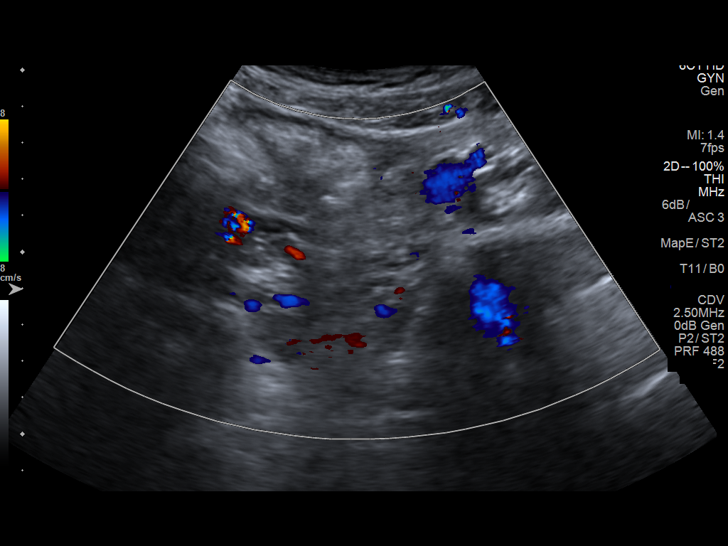
[im 36/79]
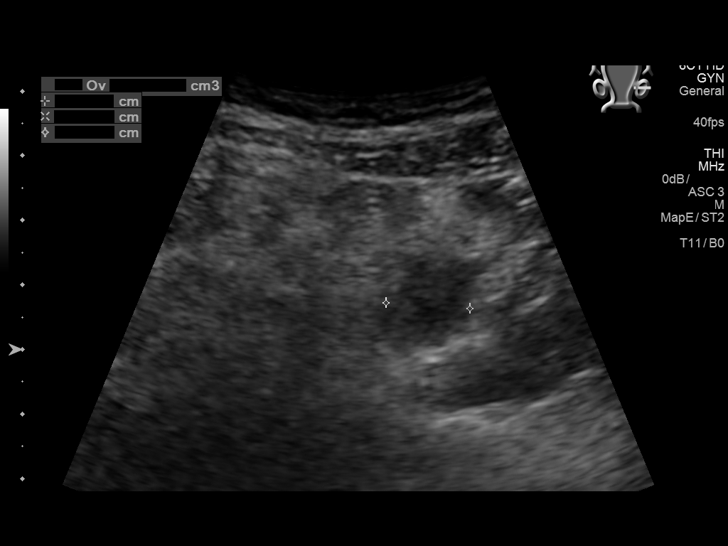
[im 43/79]
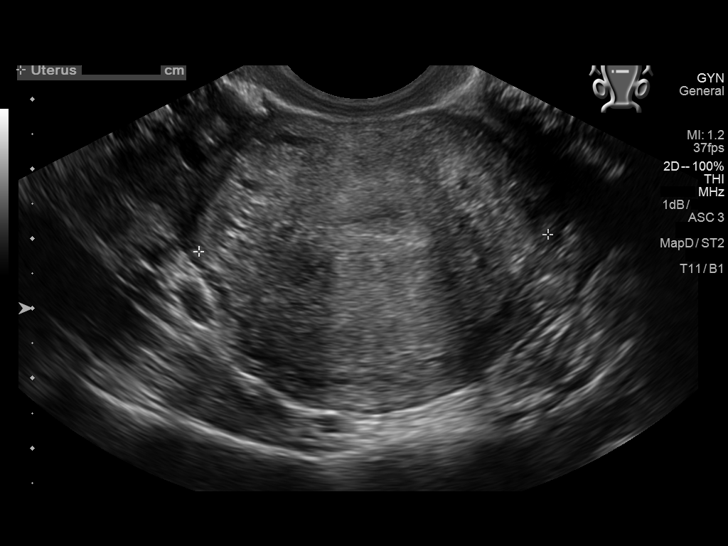
[im 49/79]
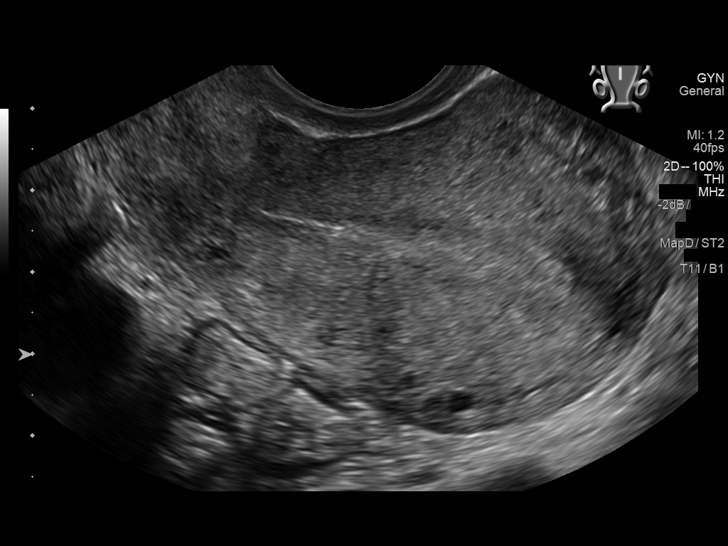
[im 53/79]
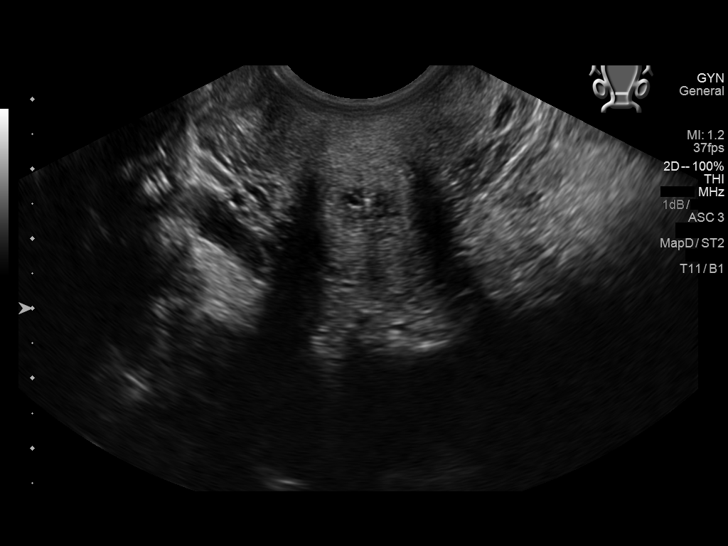
[im 59/79]
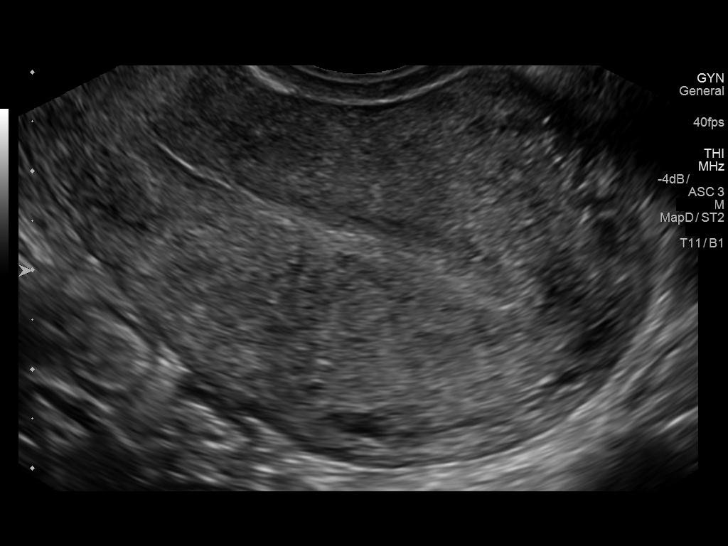
[im 66/79]
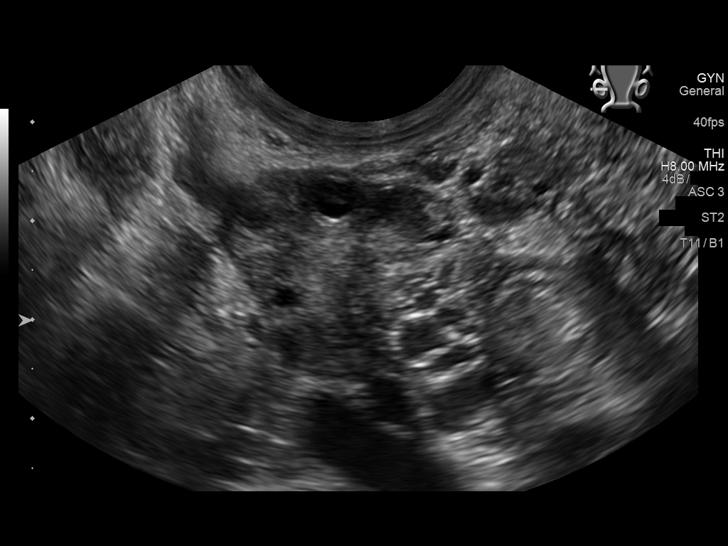
[im 72/79]
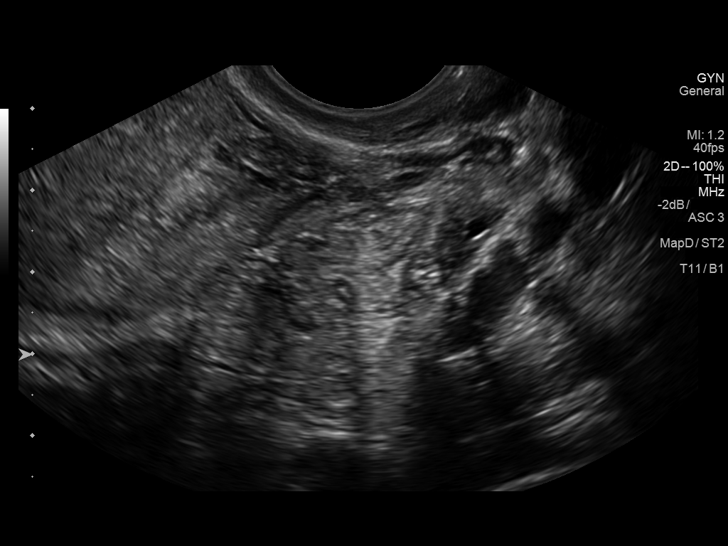
[im 79/79]
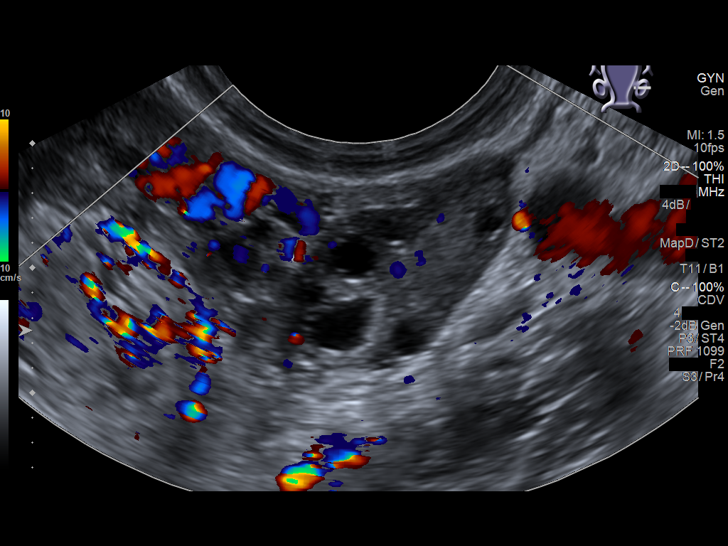

[14 of 25 positions shown; findings below may reference images not displayed]

FINDINGS: Uterus

Measurements: 9.5 x 4.7 x 4.7 cm. 9 mm fibroid is seen on the right
side.

Endometrium

Thickness: 9 mm which is within normal limits for patient of
reproductive age.. No focal abnormality visualized.

Right ovary

Measurements: 3.8 x 2.5 x 1.9 cm. Normal appearance/no adnexal mass.

Left ovary

Measurements: 2.9 x 1.6 x 1.5 cm. Normal appearance/no adnexal mass.

Other findings

Trace free fluid is noted which most likely is physiologic.
IMPRESSION: Small uterine fibroid is noted. No other abnormality seen in the
pelvis.

## 2018-11-14 ENCOUNTER — Other Ambulatory Visit: Payer: Self-pay

## 2018-11-14 ENCOUNTER — Encounter: Payer: Self-pay | Admitting: Emergency Medicine

## 2018-11-14 ENCOUNTER — Emergency Department
Admission: EM | Admit: 2018-11-14 | Discharge: 2018-11-14 | Disposition: A | Discharge status: Home / Self Care | Payer: HRSA Program | Attending: Emergency Medicine | Admitting: Emergency Medicine

## 2018-11-14 ENCOUNTER — Emergency Department: Payer: HRSA Program

## 2018-11-14 DIAGNOSIS — O26892 Other specified pregnancy related conditions, second trimester: Secondary | ICD-10-CM | POA: Insufficient documentation

## 2018-11-14 DIAGNOSIS — R51 Headache: Secondary | ICD-10-CM | POA: Insufficient documentation

## 2018-11-14 DIAGNOSIS — Z3A14 14 weeks gestation of pregnancy: Secondary | ICD-10-CM | POA: Diagnosis not present

## 2018-11-14 DIAGNOSIS — Z3490 Encounter for supervision of normal pregnancy, unspecified, unspecified trimester: Secondary | ICD-10-CM

## 2018-11-14 DIAGNOSIS — Z20828 Contact with and (suspected) exposure to other viral communicable diseases: Secondary | ICD-10-CM | POA: Diagnosis not present

## 2018-11-14 DIAGNOSIS — Z79899 Other long term (current) drug therapy: Secondary | ICD-10-CM | POA: Diagnosis not present

## 2018-11-14 DIAGNOSIS — R5381 Other malaise: Secondary | ICD-10-CM

## 2018-11-14 DIAGNOSIS — O26812 Pregnancy related exhaustion and fatigue, second trimester: Secondary | ICD-10-CM | POA: Insufficient documentation

## 2018-11-14 LAB — URINALYSIS, COMPLETE (UACMP) WITH MICROSCOPIC
Bacteria, UA: NONE SEEN
Bilirubin Urine: NEGATIVE
Glucose, UA: NEGATIVE mg/dL
Hgb urine dipstick: NEGATIVE
Ketones, ur: NEGATIVE mg/dL
Leukocytes,Ua: NEGATIVE
Nitrite: NEGATIVE
Protein, ur: NEGATIVE mg/dL
Specific Gravity, Urine: 1.014 (ref 1.005–1.030)
pH: 7 (ref 5.0–8.0)

## 2018-11-14 LAB — POCT PREGNANCY, URINE: Preg Test, Ur: POSITIVE — AB

## 2018-11-14 LAB — HCG, QUANTITATIVE, PREGNANCY: hCG, Beta Chain, Quant, S: 90023 m[IU]/mL — ABNORMAL HIGH (ref <5)

## 2018-11-14 LAB — SARS CORONAVIRUS 2 (TAT 6-24 HRS): SARS Coronavirus 2: NEGATIVE

## 2018-11-14 NOTE — ED Notes (Addendum)
See triage note  Presents with frontal h/a and "just not feeling well"  Denies fever or n/v  States she is about [redacted] weeks pregnant but has not started any care  Denies any vaginal bleeding or cramping

## 2018-11-14 NOTE — ED Triage Notes (Signed)
Pt in via POV, reports being approximately [redacted] weeks pregnant without any prenatal care.  States, "I'm just not feeling good, I keep catching headaches and I just want to make sure the baby is ok with all this COVID-19."  Ambulatory to triage, vitals WDL, NAD noted at this time.

## 2018-11-14 NOTE — Discharge Instructions (Addendum)
Keep your appointment on August 28 with your doctor.  You may take Tylenol if needed for body aches, headache if needed.  Do not take any anti-inflammatory such as ibuprofen, Advil or Aleve.  Increase fluids.

## 2018-11-14 NOTE — ED Notes (Signed)
Pt refusing covid swab at this time. Pt states " Am I being charged for this visit? My initial concerns have not been met, I want to know if my baby is ok, how can you tell if my baby is ok without an ultrasound, I could have gotten a covid swab down the street for free" pt requests to speak with PA. PA notified.

## 2018-11-14 NOTE — ED Provider Notes (Signed)
[facility] Emergency Department Provider Note  ____________________________________________   First MD Initiated Contact with Patient 11/14/18 1053     (approximate)  I have reviewed the triage vital signs and the nursing notes.   HISTORY  Chief Complaint Headache   HPI Kathy Hardin is a 28 y.o. female presents to the ED with complaint of "not feeling well".  Patient denies any nausea, vomiting or diarrhea.  There is been no history of fever, chills, upper respiratory symptoms or cough.  Patient denies any exposure to COVID.  She states that she took a pregnancy test at home which was positive.  She is here today because she is now worried about COVID and "her baby".  To her estimates she thinks she is 14 weeks.  She denies any abdominal pain, vaginal discharge or bleeding.  She has an appointment on August 28 for an OB/GYN.  She rates her discomfort as a 7 out of 10.       Past Medical History:  Diagnosis Date  . NVD (normal vaginal delivery) 10/31/2010    Patient Active Problem List   Diagnosis Date Noted  . NVD (normal vaginal delivery) 10/31/2010    History reviewed. No pertinent surgical history.  Prior to Admission medications   Medication Sig Start Date End Date Taking? Authorizing Provider  estrogens conjugated, synthetic A, (CENESTIN) 1.25 MG tablet Take 1.25 mg by mouth every morning.    [provider]    Allergies Aspirin and Oxycodone  No family history on file.  Social History Social History   Tobacco Use  . Smoking status: Never Smoker  . Smokeless tobacco: Never Used  Substance Use Topics  . Alcohol use: No    Frequency: Never  . Drug use: No    Review of Systems Constitutional: No fever/chills Eyes: No visual changes. ENT: No sore throat.  Negative for ear pain. Cardiovascular: Denies chest pain. Respiratory: Denies shortness of breath.  Negative for cough. Gastrointestinal: No abdominal pain.  No  nausea, no vomiting.  No diarrhea.  No constipation. Genitourinary: Negative for dysuria.  Negative for vaginal discharge or bleeding. Musculoskeletal: Negative for muscle aches. Skin: Negative for rash. Neurological: Positive for generalized intermittent headaches, negative for focal weakness or numbness. ____________________________________________   PHYSICAL EXAM:  VITAL SIGNS: ED Triage Vitals [11/14/18 1047]  Enc Vitals Group     BP 114/67     Pulse Rate 84     Resp 16     Temp 98.7 F (37.1 C)     Temp Source Oral     SpO2 99 %     Weight 158 lb (71.7 kg)     Height 5\' 5"  (1.651 m)     Head Circumference      Peak Flow      Pain Score 7     Pain Loc      Pain Edu?      Excl. in [place]?    Constitutional: Alert and oriented. Well appearing and in no acute distress. Eyes: Conjunctivae are normal.  Head: Atraumatic. Nose: No congestion/rhinnorhea. Neck: No stridor.   Hematological/Lymphatic/Immunilogical: No cervical lymphadenopathy. Cardiovascular: Normal rate, regular rhythm. Grossly normal heart sounds.  Good peripheral circulation. Respiratory: Normal respiratory effort.  No retractions. Lungs CTAB. Gastrointestinal: Soft and nontender. No distention.  Musculoskeletal: Moves upper and lower extremities with any difficulty.  Normal gait was noted. Neurologic:  Normal speech and language. No gross focal neurologic deficits are appreciated. No gait instability. Skin:  Skin is warm, dry and intact. No rash noted. Psychiatric: Mood and affect are normal. Speech and behavior are normal.  ____________________________________________   LABS (all labs ordered are listed, but only abnormal results are displayed)  Labs Reviewed  URINALYSIS, COMPLETE (UACMP) WITH MICROSCOPIC - Abnormal; Notable for the following components:      Result Value   Color, Urine YELLOW (*)    APPearance CLEAR (*)    All other components within normal limits  HCG, QUANTITATIVE, PREGNANCY -  Abnormal; Notable for the following components:   hCG, Beta Chain, Quant, S 90,023 (*)    All other components within normal limits  POCT PREGNANCY, URINE - Abnormal; Notable for the following components:   Preg Test, Ur POSITIVE (*)    All other components within normal limits  SARS CORONAVIRUS 2  POC URINE PREG, ED    PROCEDURES  Procedure(s) performed (including Critical Care):  Procedures Dr. Corky Downs was present and did a ultrasound at the bedside and determined that fetal heart tones were normal and that there was no impending danger to the baby.  ____________________________________________   INITIAL IMPRESSION / ASSESSMENT AND PLAN / ED COURSE  As part of my medical decision making, I reviewed the following data within the electronic MEDICAL RECORD NUMBER Notes from prior ED visits and [place] Controlled Substance Database    Clinical Course as of Nov 14 1434  Mon Nov 14, 2018  1209 Pregnancy, urine POC(!) [RS]    Clinical Course User Index [RS] Johnn Hai, PA-C   28 year old female presents to the ED with generalized malaise and headache with no history of COVID exposure or definite more definitive symptoms.  She denies any vaginal discharge or bleeding.  She has not experienced any abdominal discomfort.  Urinalysis was negative and patient was made aware.  She was very disgruntled that she was not getting a ultrasound and Dr. Corky Downs was available to do a bedside ultrasound and determined that there was no immediate danger to the baby and reassured patient.  Patient COVID test was pending at the time of her discharge.  She is encouraged to keep her appointment with her OB/GYN on August 28.  ____________________________________________   FINAL CLINICAL IMPRESSION(S) / ED DIAGNOSES  Final diagnoses:  Pregnancy, unspecified gestational age  [facility]     ED Discharge Orders    None       Note:  This document was prepared using Dragon voice recognition software and  may include unintentional dictation errors.    Johnn Hai, PA-C 11/14/18 1436    Earleen Newport, MD 11/14/18 [phone]

## 2019-01-06 ENCOUNTER — Encounter: Payer: Self-pay | Admitting: Advanced Practice Midwife

## 2019-01-23 DIAGNOSIS — Z3401 Encounter for supervision of normal first pregnancy, first trimester: Secondary | ICD-10-CM | POA: Insufficient documentation

## 2019-03-17 ENCOUNTER — Inpatient Hospital Stay: Payer: Medicaid Other | Attending: Oncology | Admitting: Oncology

## 2019-03-17 ENCOUNTER — Inpatient Hospital Stay: Payer: Medicaid Other

## 2019-03-17 ENCOUNTER — Other Ambulatory Visit: Payer: Self-pay

## 2019-03-17 ENCOUNTER — Encounter (INDEPENDENT_AMBULATORY_CARE_PROVIDER_SITE_OTHER): Payer: Self-pay

## 2019-03-17 ENCOUNTER — Encounter: Payer: Self-pay | Admitting: Oncology

## 2019-03-17 VITALS — BP 103/69 | HR 96 | Temp 98.3°F | Resp 18 | Ht 65.0 in | Wt 200.7 lb

## 2019-03-17 DIAGNOSIS — Z79899 Other long term (current) drug therapy: Secondary | ICD-10-CM | POA: Diagnosis not present

## 2019-03-17 DIAGNOSIS — O99013 Anemia complicating pregnancy, third trimester: Secondary | ICD-10-CM | POA: Diagnosis present

## 2019-03-17 DIAGNOSIS — D508 Other iron deficiency anemias: Secondary | ICD-10-CM | POA: Diagnosis not present

## 2019-03-17 DIAGNOSIS — D509 Iron deficiency anemia, unspecified: Secondary | ICD-10-CM | POA: Diagnosis not present

## 2019-03-17 DIAGNOSIS — E871 Hypo-osmolality and hyponatremia: Secondary | ICD-10-CM | POA: Insufficient documentation

## 2019-03-17 LAB — CBC WITH DIFFERENTIAL/PLATELET
Abs Immature Granulocytes: 0.02 10*3/uL (ref 0.00–0.07)
Basophils Absolute: 0 10*3/uL (ref 0.0–0.1)
Basophils Relative: 0 %
Eosinophils Absolute: 0.2 10*3/uL (ref 0.0–0.5)
Eosinophils Relative: 3 %
HCT: 29.8 % — ABNORMAL LOW (ref 36.0–46.0)
Hemoglobin: 9.4 g/dL — ABNORMAL LOW (ref 12.0–15.0)
Immature Granulocytes: 0 %
Lymphocytes Relative: 22 %
Lymphs Abs: 1.3 10*3/uL (ref 0.7–4.0)
MCH: 24.7 pg — ABNORMAL LOW (ref 26.0–34.0)
MCHC: 31.5 g/dL (ref 30.0–36.0)
MCV: 78.2 fL — ABNORMAL LOW (ref 80.0–100.0)
Monocytes Absolute: 0.4 10*3/uL (ref 0.1–1.0)
Monocytes Relative: 6 %
Neutro Abs: 4.1 10*3/uL (ref 1.7–7.7)
Neutrophils Relative %: 69 %
Platelets: 234 10*3/uL (ref 150–400)
RBC: 3.81 MIL/uL — ABNORMAL LOW (ref 3.87–5.11)
RDW: 13.2 % (ref 11.5–15.5)
WBC: 6 10*3/uL (ref 4.0–10.5)
nRBC: 0 % (ref 0.0–0.2)

## 2019-03-17 LAB — COMPREHENSIVE METABOLIC PANEL
ALT: 7 U/L (ref 0–44)
AST: 11 U/L — ABNORMAL LOW (ref 15–41)
Albumin: 2.6 g/dL — ABNORMAL LOW (ref 3.5–5.0)
Alkaline Phosphatase: 72 U/L (ref 38–126)
Anion gap: 9 (ref 5–15)
BUN: <5 mg/dL — ABNORMAL LOW (ref 6–20)
CO2: 22 mmol/L (ref 22–32)
Calcium: 8.2 mg/dL — ABNORMAL LOW (ref 8.9–10.3)
Chloride: 107 mmol/L (ref 98–111)
Creatinine, Ser: 0.51 mg/dL (ref 0.44–1.00)
GFR calc Af Amer: >60 mL/min (ref >60)
GFR calc non Af Amer: >60 mL/min (ref >60)
Glucose, Bld: 78 mg/dL (ref 70–99)
Potassium: 3.3 mmol/L — ABNORMAL LOW (ref 3.5–5.1)
Sodium: 138 mmol/L (ref 135–145)
Total Bilirubin: 0.3 mg/dL (ref 0.3–1.2)
Total Protein: 6.3 g/dL — ABNORMAL LOW (ref 6.5–8.1)

## 2019-03-17 LAB — IRON AND TIBC
Iron: 37 ug/dL (ref 28–170)
Saturation Ratios: 8 % — ABNORMAL LOW (ref 10.4–31.8)
TIBC: 467 ug/dL — ABNORMAL HIGH (ref 250–450)
UIBC: 430 ug/dL

## 2019-03-17 LAB — FERRITIN: Ferritin: 6 ng/mL — ABNORMAL LOW (ref 11–307)

## 2019-03-17 NOTE — Progress Notes (Signed)
Patient here for initial visit. Pt is 7 months pregnant without complications, per patient. Pt states feeling tired at times. Denies any medical problems/conditions. Family history unknown

## 2019-03-18 ENCOUNTER — Encounter: Payer: Self-pay | Admitting: Oncology

## 2019-03-18 MED ORDER — FERROUS SULFATE 325 (65 FE) MG PO TBEC: 325.0000 mg | DELAYED_RELEASE_TABLET | Freq: Two times a day (BID) | ORAL | 1 refills | Status: DC

## 2019-03-18 NOTE — Progress Notes (Signed)
Hematology/Oncology Consult note [facility] [phone] [phone]   Patient Care Team: Patient, No Pcp Per as PCP - General (General Practice)  REFERRING PROVIDER: Francetta Found, CNM CHIEF COMPLAINTS/REASON FOR VISIT:  Evaluation of iron deficiency anemia  HISTORY OF PRESENTING ILLNESS:  Kathy Hardin is a  28 y.o.  female with PMH listed below was seen in consultation at the request of McVey, Murray Hodgkins, CNM   for evaluation of iron deficiency anemia.  Patient is in her 23-month pregnancy.  She denies any pregnancy complications.   Reviewed patient's recent labs  Labs revealed anemia with hemoglobin of 9.9.  MCV 79.9. Reviewed patient's previous labs ordered by primary care physician's office, anemia onset is new.   Patient has a normal hemoglobin in 2017.  No aggravating or improving factors.  Associated signs and symptoms: Patient reports fatigue.  Denies SOB with exertion.  Denies weight loss, easy bruising, hematochezia, hemoptysis, hematuria. Context:  History of iron deficiency: Denies Rectal bleeding: Denies Menstrual bleeding/ Vaginal bleeding : Moderate flow. Hematemesis or hemoptysis : denies Blood in urine : denies   Last endoscopy: Never had Fatigue: Yes.  SOB: deneis    Review of Systems  Constitutional: Positive for fatigue. Negative for appetite change, chills and fever.  HENT:   Negative for hearing loss and voice change.   Eyes: Negative for eye problems.  Respiratory: Negative for chest tightness and cough.   Cardiovascular: Negative for chest pain.  Gastrointestinal: Negative for abdominal distention, abdominal pain and blood in stool.  Endocrine: Negative for hot flashes.  Genitourinary: Negative for difficulty urinating and frequency.   Musculoskeletal: Negative for arthralgias.  Skin: Negative for itching and rash.  Neurological: Negative for extremity weakness.  Hematological: Negative for  adenopathy.  Psychiatric/Behavioral: Negative for confusion.    MEDICAL HISTORY:  Past Medical History:  Diagnosis Date  . NVD (normal vaginal delivery) 10/31/2010    SURGICAL HISTORY: History reviewed. No pertinent surgical history.  SOCIAL HISTORY: Social History   Socioeconomic History  . Marital status: Single    Spouse name: Not on file  . Number of children: Not on file  . Years of education: Not on file  . Highest education level: Not on file  Occupational History  . Not on file  Tobacco Use  . Smoking status: Never Smoker  . Smokeless tobacco: Never Used  Substance and Sexual Activity  . Alcohol use: No  . Drug use: No  . Sexual activity: Yes    Birth control/protection: Condom  Other Topics Concern  . Not on file  Social History Narrative  . Not on file   Social Determinants of Health   Financial Resource Strain:   . Difficulty of Paying Living Expenses: Not on file  Food Insecurity:   . Worried About Charity fundraiser in the Last Year: Not on file  . Ran Out of Food in the Last Year: Not on file  Transportation Needs:   . Lack of Transportation (Medical): Not on file  . Lack of Transportation (Non-Medical): Not on file  Physical Activity:   . Days of Exercise per Week: Not on file  . Minutes of Exercise per Session: Not on file  Stress:   . Feeling of Stress : Not on file  Social Connections:   . Frequency of Communication with Friends and Family: Not on file  . Frequency of Social Gatherings with Friends and Family: Not on file  . Attends Religious Services: Not on  file  . Active Member of Clubs or Organizations: Not on file  . Attends Archivist Meetings: Not on file  . Marital Status: Not on file  Intimate Partner Violence:   . Fear of Current or Ex-Partner: Not on file  . Emotionally Abused: Not on file  . Physically Abused: Not on file  . Sexually Abused: Not on file    FAMILY HISTORY: Family History  Family history  unknown: Yes    ALLERGIES:  is allergic to aspirin-acetaminophen-caffeine; aspirin; and oxycodone.  MEDICATIONS:  Current Outpatient Medications  Medication Sig Dispense Refill  . Prenatal Vit-Fe Fumarate-FA (PRENATAL VITAMIN PLUS LOW IRON PO) Take by mouth.    . estrogens conjugated, synthetic A, (CENESTIN) 1.25 MG tablet Take 1.25 mg by mouth every morning.     No current facility-administered medications for this visit.     PHYSICAL EXAMINATION: ECOG PERFORMANCE STATUS: 0 - Asymptomatic Vitals:   03/17/19 0954  BP: 103/69  Pulse: 96  Resp: 18  Temp: 98.3 F (36.8 C)   Filed Weights   03/17/19 0954  Weight: 200 lb 11.2 oz (91 kg)    Physical Exam Constitutional:      General: She is not in acute distress. HENT:     Head: Normocephalic and atraumatic.  Eyes:     General: No scleral icterus.    Pupils: Pupils are equal, round, and reactive to light.  Cardiovascular:     Rate and Rhythm: Normal rate and regular rhythm.     Heart sounds: Normal heart sounds.  Pulmonary:     Effort: Pulmonary effort is normal. No respiratory distress.     Breath sounds: No wheezing.  Abdominal:     General: Bowel sounds are normal.     Palpations: Abdomen is soft.     Comments: Gravid uterus  Musculoskeletal:        General: No deformity. Normal range of motion.     Cervical back: Normal range of motion and neck supple.  Skin:    General: Skin is warm and dry.     Findings: No erythema or rash.  Neurological:     Mental Status: She is alert and oriented to person, place, and time.     Cranial Nerves: No cranial nerve deficit.     Coordination: Coordination normal.  Psychiatric:        Mood and Affect: Mood normal.       CMP Latest Ref Rng & Units 03/17/2019  Glucose 70 - 99 mg/dL 78  BUN 6 - 20 mg/dL <5(L)  Creatinine 0.44 - 1.00 mg/dL 0.51  Sodium 135 - 145 mmol/L 138  Potassium 3.5 - 5.1 mmol/L 3.3(L)  Chloride 98 - 111 mmol/L 107  CO2 22 - 32 mmol/L 22  Calcium  8.9 - 10.3 mg/dL 8.2(L)  Total Protein 6.5 - 8.1 g/dL 6.3(L)  Total Bilirubin 0.3 - 1.2 mg/dL 0.3  Alkaline Phos 38 - 126 U/L 72  AST 15 - 41 U/L 11(L)  ALT 0 - 44 U/L 7   CBC Latest Ref Rng & Units 03/17/2019  WBC 4.0 - 10.5 K/uL 6.0  Hemoglobin 12.0 - 15.0 g/dL 9.4(L)  Hematocrit 36.0 - 46.0 % 29.8(L)  Platelets 150 - 400 K/uL 234     LABORATORY DATA:  I have reviewed the data as listed Lab Results  Component Value Date   WBC 6.0 03/17/2019   HGB 9.4 (L) 03/17/2019   HCT 29.8 (L) 03/17/2019   MCV 78.2 (L) 03/17/2019  PLT 234 03/17/2019   Recent Labs    03/17/19 1130  NA 138  K 3.3*  CL 107  CO2 22  GLUCOSE 78  BUN <5*  CREATININE 0.51  CALCIUM 8.2*  GFRNONAA >60  GFRAA >60  PROT 6.3*  ALBUMIN 2.6*  AST 11*  ALT 7  ALKPHOS 72  BILITOT 0.3   Iron/TIBC/Ferritin/ %Sat    Component Value Date/Time   IRON 37 03/17/2019 1130   TIBC 467 (H) 03/17/2019 1130   FERRITIN 6 (L) 03/17/2019 1130   IRONPCTSAT 8 (L) 03/17/2019 1130     No results found.    ASSESSMENT & PLAN:  1. Microcytic anemia   2. Other iron deficiency anemia    Labs are reviewed and discussed with patient.  Patient has microcytic anemia, differential includes iron deficiency versus hemoglobinopathy Recommend check CBC, iron, TIBC, ferritin.  CMP. We discussed the option of IV iron treatment if patient has severe iron deficiency. Allergy reactions/infusion reaction including anaphylactic reaction discussed with patient. Other side effects include but not limited to high blood pressure, skin rash, weight gain, leg swelling, etc. Patient voices understanding and willing to proceed.  Patient prefers to try oral iron supplementation first.  If no improvement, she will try IV iron treatments. Ferrous sulfate 325 mg twice daily prescription was sent to pharmacy #Hyponatremia, potassium 3.3.-This seems to be a chronic problem for her.  Patient has been having low potassium levels since 2014.   Potassium level is at his chronic baseline. Advised patient to take potassium enriched food.  Follow-up with OB/GYN. Orders Placed This Encounter  Procedures  . CBC with Differential/Platelet    Standing Status:   Future    Number of Occurrences:   1    Standing Expiration Date:   03/16/2020  . Technologist smear review    Standing Status:   Future    Standing Expiration Date:   03/16/2020  . Iron and TIBC    Standing Status:   Future    Number of Occurrences:   1    Standing Expiration Date:   03/16/2020  . Ferritin    Standing Status:   Future    Number of Occurrences:   1    Standing Expiration Date:   03/16/2020  . Comprehensive metabolic panel    Standing Status:   Future    Number of Occurrences:   1    Standing Expiration Date:   03/16/2020  . CBC with Differential/Platelet    Standing Status:   Future    Standing Expiration Date:   03/16/2020  . Ferritin    Standing Status:   Future    Standing Expiration Date:   03/16/2020  . Iron and TIBC    Standing Status:   Future    Standing Expiration Date:   03/16/2020    All questions were answered. The patient knows to call the clinic with any problems questions or concerns.  Cc McVey, Murray Hodgkins, CNM  Return of visit: 4 weeks Thank you for this kind referral and the opportunity to participate in the care of this patient. A copy of today's note is routed to referring provider  Total face to face encounter time for this patient visit was 80min. >50% of the time was  spent in counseling and coordination of care.    Earlie Server, MD, PhD Hematology Oncology [facility] Pager- [phone] 03/18/2019

## 2019-03-20 ENCOUNTER — Telehealth: Payer: Self-pay

## 2019-03-20 NOTE — Telephone Encounter (Signed)
Patient has been notified

## 2019-03-20 NOTE — Telephone Encounter (Signed)
-----   Message from Earlie Server, MD sent at 03/18/2019 10:06 PM EST ----- Please let patient know that her iron level is very low.  As we discussed, I sent oral iron supplementation tablets to her pharmacy.  She can take it twice daily.  If she develops constipation, I advised patient to check with OB/GYN if regular stool Colace is safe for her to take.  Follow-up as planned.

## 2019-03-27 ENCOUNTER — Other Ambulatory Visit: Payer: Self-pay

## 2019-03-27 ENCOUNTER — Emergency Department
Admission: EM | Admit: 2019-03-27 | Discharge: 2019-03-28 | Disposition: A | Discharge status: Home / Self Care | Payer: Medicaid Other | Attending: Emergency Medicine | Admitting: Emergency Medicine

## 2019-03-27 ENCOUNTER — Encounter: Payer: Self-pay | Admitting: Emergency Medicine

## 2019-03-27 DIAGNOSIS — B349 Viral infection, unspecified: Secondary | ICD-10-CM

## 2019-03-27 DIAGNOSIS — U071 COVID-19: Secondary | ICD-10-CM | POA: Diagnosis not present

## 2019-03-27 DIAGNOSIS — Z79899 Other long term (current) drug therapy: Secondary | ICD-10-CM | POA: Diagnosis not present

## 2019-03-27 DIAGNOSIS — R509 Fever, unspecified: Secondary | ICD-10-CM | POA: Diagnosis present

## 2019-03-27 DIAGNOSIS — Z20822 Contact with and (suspected) exposure to covid-19: Secondary | ICD-10-CM

## 2019-03-27 NOTE — ED Notes (Addendum)
FHTs located to rt upper abd via doppler; rate 152 and regular

## 2019-03-27 NOTE — ED Triage Notes (Signed)
Patient ambulatory to triage with steady gait, without difficulty or distress noted, mask in place; mom reports x 2 days having fever, cough & congestion; mom here with child to be seen for same

## 2019-03-28 NOTE — Discharge Instructions (Signed)
As we discussed, we believe your symptoms are caused by a respiratory virus.  However, because we cannot rule out the possibility of COVID-19 at this time, we sent a nasal swab test from the Emergency Department (ED).  Please read through the included information in this paperwork for information about how to set up a MyChart account so that you can log in over the next couple of days for your test results (including COVID-19 swab results if one was obtained during your Emergency Department visit).  Read through all the included information including the recommendations from the CDC.  If your test is positive, we recommend that you self-quarantine at home for 10-14 days after your fever has gone away (without taking medication to make your temperature come down, such as Tylenol (acetaminophen)), after your respiratory symptoms have improved, and after at least 14 days have passed since your symptoms first appeared.  You should have as minimal contact as possible with anyone else including close family as per the CDC paperwork guidelines listed below. Follow-up with your doctor by phone or online as needed and return immediately to the emergency department or call 911 only if you develop new or worsening symptoms that concern you.  You can find up-to-date information about COVID-19 in [place] by calling the [place] Coronavirus Helpline: 1-866-462-3821. You may also call 2-1-1, or 888-892-1162, or additional resources.  You can also find information online at https://www.ncdhhs.gov/divisions/public-health/coronavirus-disease-2019-covid-19-response-[place], or on the Center for Disease Control (CDC) website at https://www.cdc.gov/coronavirus/2019-ncov/index.html.  

## 2019-03-28 NOTE — ED Notes (Signed)
Pt understands teaching concerning covid.

## 2019-03-28 NOTE — ED Provider Notes (Signed)
[facility] Emergency Department Provider Note  ____________________________________________   First MD Initiated Contact with Patient 03/27/19 2341     (approximate)  I have reviewed the triage vital signs and the nursing notes.   HISTORY  Chief Complaint Fever    HPI Kathy Hardin is a 28 y.o. female  who is about [redacted] weeks gestation who presents for 2 to 3 days of Covid-like symptoms that include subjective fever, mild cough, nasal congestion.  She says that she was exposed to Covid a few days ago and then she lost her smell and taste earlier today.  She brought in her 29-year-old child who has been having the same symptoms but she also brought to other children that are currently asymptomatic.  Once she lost her at least had decreased smell and taste today she decided she should come to the emergency department to be checked out.  Nothing particular makes his symptoms better or worse.  She is eating and drinking normally.  No difficulty with ambulation.  She goes to [facility] for [facility] care.  No vaginal bleeding.  No nausea nor vomiting.        Past Medical History:  Diagnosis Date  . NVD (normal vaginal delivery) 10/31/2010    Patient Active Problem List   Diagnosis Date Noted  . Encounter for supervision of normal first pregnancy in first trimester 01/23/2019  . NVD (normal vaginal delivery) 10/31/2010    History reviewed. No pertinent surgical history.  Prior to Admission medications   Medication Sig Start Date End Date Taking? Authorizing Provider  estrogens conjugated, synthetic A, (CENESTIN) 1.25 MG tablet Take 1.25 mg by mouth every morning.    [provider]  ferrous sulfate 325 (65 FE) MG EC tablet Take 1 tablet (325 mg total) by mouth 2 (two) times daily with a meal. 03/18/19   Earlie Server, MD  Prenatal Vit-Fe Fumarate-FA (PRENATAL VITAMIN PLUS LOW IRON PO) Take by mouth.    [provider]     Allergies Aspirin-acetaminophen-caffeine, Aspirin, and Oxycodone  Family History  Family history unknown: Yes    Social History Social History   Tobacco Use  . Smoking status: Never Smoker  . Smokeless tobacco: Never Used  Substance Use Topics  . Alcohol use: No  . Drug use: No    Review of Systems Constitutional: Subjective fever/chills Eyes: No visual changes. ENT: No sore throat.  Loss of smell and taste.  Nasal congestion. Cardiovascular: Denies chest pain. Respiratory: Mild cough.  Denies shortness of breath. Gastrointestinal: No abdominal pain.  No nausea, no vomiting.  No diarrhea.  No constipation. Genitourinary: No vaginal bleeding.  Negative for dysuria. Musculoskeletal: Negative for neck pain.  Negative for back pain. Integumentary: Negative for rash. Neurological: Negative for headaches, focal weakness or numbness.   ____________________________________________   PHYSICAL EXAM:  VITAL SIGNS: ED Triage Vitals [03/27/19 2225]  Enc Vitals Group     BP (!) 138/92     Pulse Rate 91     Resp 20     Temp 97.7 F (36.5 C)     Temp Source Oral     SpO2 100 %     Weight 89.2 kg (196 lb 10.4 oz)     Height 1.651 m (5\' 5" )     Head Circumference      Peak Flow      Pain Score 0     Pain Loc      Pain Edu?  Excl. in [place]?     Constitutional: Alert and oriented.  No acute distress. Eyes: Conjunctivae are normal.  Head: Atraumatic. Nose: Mild congestion/rhinnorhea. Mouth/Throat: Patient is wearing a mask. Neck: No stridor.  No meningeal signs.   Cardiovascular: Normal rate, regular rhythm. Good peripheral circulation. Grossly normal heart sounds. Respiratory: Normal respiratory effort.  No retractions. Gastrointestinal: Gravid uterus consistent with reported gestational age. Neurologic:  Normal speech and language. No gross focal neurologic deficits are appreciated.  Ambulatory with no difficulty. Skin:  Skin is warm, dry and  intact. Psychiatric: Mood and affect are normal. Speech and behavior are normal.  ____________________________________________   LABS (all labs ordered are listed, but only abnormal results are displayed)  Labs Reviewed  NOVEL CORONAVIRUS, NAA (HOSPITAL ORDER, SEND-OUT TO REF LAB)   ____________________________________________  EKG  None - EKG not ordered by ED physician ____________________________________________  RADIOLOGY Ursula Alert, personally viewed and evaluated these images (plain radiographs) as part of my medical decision making, as well as reviewing the written report by the radiologist.  ED MD interpretation: No indication for emergent imaging  Official radiology report(s): No results found.  ____________________________________________   PROCEDURES   Procedure(s) performed (including Critical Care):  Procedures   ____________________________________________   INITIAL IMPRESSION / MDM / [place] / ED COURSE  As part of my medical decision making, I reviewed the following data within the [place] notes reviewed and incorporated and Notes from prior ED visits   Probable COVID-19, very unlikely to represent community-acquired pneumonia or other emergent medical condition requiring treatment.  I had my usual customary Covid discussion with the patient.  She was swabbed and I told her about how to check MyChart for the result.  I encouraged her to let her OB provider know about her symptoms and her test results but otherwise she and her children should stay at home and isolate themselves from others.  I gave my usual customary return precautions.          ____________________________________________  FINAL CLINICAL IMPRESSION(S) / ED DIAGNOSES  Final diagnoses:  Suspected COVID-19 virus infection  Viral syndrome     MEDICATIONS GIVEN DURING THIS VISIT:  Medications - No data to display   ED Discharge  Orders    None      *Please note:  Kathy Hardin was evaluated in Emergency Department on 03/28/2019 for the symptoms described in the history of present illness. She was evaluated in the context of the global COVID-19 pandemic, which necessitated consideration that the patient might be at risk for infection with the SARS-CoV-2 virus that causes COVID-19. Institutional protocols and algorithms that pertain to the evaluation of patients at risk for COVID-19 are in a state of rapid change based on information released by regulatory bodies including the CDC and federal and state organizations. These policies and algorithms were followed during the patient's care in the ED.  Some ED evaluations and interventions may be delayed as a result of limited staffing during the pandemic.*  Note:  This document was prepared using Dragon voice recognition software and may include unintentional dictation errors.   Hinda Kehr, MD 03/28/19 [phone]

## 2019-03-29 LAB — NOVEL CORONAVIRUS, NAA (HOSP ORDER, SEND-OUT TO REF LAB; TAT 18-24 HRS): SARS-CoV-2, NAA: DETECTED — AB

## 2019-04-06 ENCOUNTER — Other Ambulatory Visit: Payer: Self-pay | Admitting: Oncology

## 2019-04-06 ENCOUNTER — Encounter: Payer: Self-pay | Admitting: Oncology

## 2019-04-06 DIAGNOSIS — D509 Iron deficiency anemia, unspecified: Secondary | ICD-10-CM

## 2019-04-06 HISTORY — DX: Iron deficiency anemia, unspecified: D50.9

## 2019-04-07 NOTE — L&D Delivery Note (Signed)
Delivery Note At 6:06 AM a viable female was delivered via Vaginal, Spontaneous (Presentation: VTX   Occiput Posterior).  APGAR: 8, 9; weight  .   Placenta status: Spontaneous, Intact.  Cord: 3 vessels with the following complications: None.   Anesthesia: None Episiotomy: None Lacerations: 2nd degree;Perineal Suture Repair: 2.0 3.0 vicryl Est. Blood Loss (mL): 395  Mom to postpartum.  Baby to Couplet care / Skin to Skin.  Kathy Hardin 05/11/2019, 7:01 AM

## 2019-04-12 ENCOUNTER — Inpatient Hospital Stay: Payer: Medicaid Other | Attending: Oncology

## 2019-04-12 NOTE — Progress Notes (Deleted)
Called patient & left voice mail.

## 2019-04-13 ENCOUNTER — Telehealth: Payer: Self-pay

## 2019-04-13 ENCOUNTER — Telehealth: Payer: Self-pay | Admitting: *Deleted

## 2019-04-13 ENCOUNTER — Inpatient Hospital Stay: Payer: Medicaid Other | Admitting: Oncology

## 2019-04-13 ENCOUNTER — Inpatient Hospital Stay: Payer: Medicaid Other

## 2019-04-13 NOTE — Telephone Encounter (Signed)
Patient was scheduled for MD virtual/poss Venofer today.  Tried contacting patient multiple times with no success.  She did not get her scheduled labs drawn on 04/12/19.

## 2019-04-13 NOTE — Telephone Encounter (Signed)
tried calling pt x2 this morning no anwser.  A detailed message was left asking her to contact the office so that her missed 1/6 and 04/13/19 lab/MD/+/- Venofer appts can be R/S.

## 2019-04-13 NOTE — Telephone Encounter (Signed)
I tried calling pt x2 this morning as well, no anwser.  A detailed message was left asking her to contact the office so that her missed 1/6 and 04/13/19 appts can be R/S.

## 2019-05-10 ENCOUNTER — Other Ambulatory Visit: Payer: Self-pay

## 2019-05-10 NOTE — Progress Notes (Signed)
[id number] with EDD of 05/22/2019 based off of LMP of 08/15/2018, consistent with 24wk [place]. Scheduled for elective induction of labor on 05/15/2019.   Prenatal provider: [facility]  Pregnancy complicated by: 1. Late entry into prenatal care at [redacted] weeks gestation  2. Anemia in pregnancy w/ hematology consult  3. Covid-19 infection in pregnancy - 03/27/19  Prenatal Labs: Blood type/Rh O positive  Antibody screen neg  Rubella Immune  Varicella Immune  RPR NR  HBsAg Neg  HIV NR  GC neg  Chlamydia neg  Genetic screening negative  1 hour GTT 122  3 hour GTT N/A  GBS Neg   Tdap: declined  Flu: declined  Contraception: BTL - papers signed 03/08/19 Feeding preference: TBD   ____ Drinda Butts, CNM Certified Nurse Midwife [facility]

## 2019-05-11 ENCOUNTER — Other Ambulatory Visit: Payer: Self-pay

## 2019-05-11 ENCOUNTER — Inpatient Hospital Stay
Admission: EM | Admit: 2019-05-11 | Discharge: 2019-05-13 | DRG: 797 | Disposition: A | Discharge status: Home / Self Care | Payer: Medicaid Other | Attending: Obstetrics and Gynecology | Admitting: Obstetrics and Gynecology

## 2019-05-11 DIAGNOSIS — D509 Iron deficiency anemia, unspecified: Secondary | ICD-10-CM | POA: Diagnosis present

## 2019-05-11 DIAGNOSIS — Z302 Encounter for sterilization: Secondary | ICD-10-CM

## 2019-05-11 DIAGNOSIS — Z3A38 38 weeks gestation of pregnancy: Secondary | ICD-10-CM | POA: Diagnosis not present

## 2019-05-11 DIAGNOSIS — O479 False labor, unspecified: Secondary | ICD-10-CM | POA: Diagnosis present

## 2019-05-11 DIAGNOSIS — O9081 Anemia of the puerperium: Secondary | ICD-10-CM | POA: Diagnosis not present

## 2019-05-11 DIAGNOSIS — O9279 Other disorders of lactation: Secondary | ICD-10-CM | POA: Diagnosis present

## 2019-05-11 DIAGNOSIS — Z8616 Personal history of COVID-19: Secondary | ICD-10-CM

## 2019-05-11 DIAGNOSIS — D62 Acute posthemorrhagic anemia: Secondary | ICD-10-CM | POA: Diagnosis not present

## 2019-05-11 DIAGNOSIS — Z3490 Encounter for supervision of normal pregnancy, unspecified, unspecified trimester: Secondary | ICD-10-CM | POA: Diagnosis present

## 2019-05-11 DIAGNOSIS — O26893 Other specified pregnancy related conditions, third trimester: Secondary | ICD-10-CM | POA: Diagnosis present

## 2019-05-11 LAB — CBC
HCT: 32.4 % — ABNORMAL LOW (ref 36.0–46.0)
Hemoglobin: 10.7 g/dL — ABNORMAL LOW (ref 12.0–15.0)
MCH: 25.2 pg — ABNORMAL LOW (ref 26.0–34.0)
MCHC: 33 g/dL (ref 30.0–36.0)
MCV: 76.2 fL — ABNORMAL LOW (ref 80.0–100.0)
Platelets: 225 10*3/uL (ref 150–400)
RBC: 4.25 MIL/uL (ref 3.87–5.11)
RDW: 15.8 % — ABNORMAL HIGH (ref 11.5–15.5)
WBC: 6.9 10*3/uL (ref 4.0–10.5)
nRBC: 0 % (ref 0.0–0.2)

## 2019-05-11 LAB — TYPE AND SCREEN
ABO/RH(D): O POS
Antibody Screen: NEGATIVE

## 2019-05-11 LAB — RPR: RPR Ser Ql: NONREACTIVE

## 2019-05-11 MED ORDER — OXYTOCIN BOLUS FROM INFUSION: 500.0000 mL | Freq: Once | INTRAVENOUS | Status: DC

## 2019-05-11 MED ORDER — OXYTOCIN BOLUS FROM INFUSION: 500.0000 mL | Freq: Once | INTRAVENOUS | Status: AC

## 2019-05-11 MED ORDER — HYDROCODONE-ACETAMINOPHEN 5-325 MG PO TABS
1.0000 | ORAL_TABLET | ORAL | Status: DC | PRN
  Administered 2019-05-11 – 2019-05-12 (×3): 1 via ORAL
  Filled 2019-05-11 (×6): qty 1

## 2019-05-11 MED ORDER — ACETAMINOPHEN 325 MG PO TABS: 650.0000 mg | ORAL_TABLET | ORAL | Status: DC | PRN

## 2019-05-11 MED ORDER — IBUPROFEN 600 MG PO TABS
600.0000 mg | ORAL_TABLET | Freq: Four times a day (QID) | ORAL | Status: DC
  Administered 2019-05-11 – 2019-05-12 (×6): 600 mg via ORAL
  Filled 2019-05-11 (×6): qty 1

## 2019-05-11 MED ORDER — OXYTOCIN 40 UNITS IN NORMAL SALINE INFUSION - SIMPLE MED: 2.5000 [IU]/h | INTRAVENOUS | Status: DC

## 2019-05-11 MED ORDER — SENNOSIDES-DOCUSATE SODIUM 8.6-50 MG PO TABS
2.0000 | ORAL_TABLET | ORAL | Status: DC
  Administered 2019-05-12: 2 via ORAL
  Filled 2019-05-11 (×2): qty 2

## 2019-05-11 MED ORDER — FERROUS SULFATE 325 (65 FE) MG PO TABS
325.0000 mg | ORAL_TABLET | Freq: Two times a day (BID) | ORAL | Status: DC
  Administered 2019-05-11 – 2019-05-13 (×4): 325 mg via ORAL
  Filled 2019-05-11 (×4): qty 1

## 2019-05-11 MED ORDER — LIDOCAINE HCL (PF) 1 % IJ SOLN
INTRAMUSCULAR | Status: AC
  Administered 2019-05-11: 30 mL via SUBCUTANEOUS
  Filled 2019-05-11: qty 30

## 2019-05-11 MED ORDER — ONDANSETRON HCL 4 MG PO TABS: 4.0000 mg | ORAL_TABLET | ORAL | Status: DC | PRN

## 2019-05-11 MED ORDER — ONDANSETRON HCL 4 MG/2ML IJ SOLN: 4.0000 mg | Freq: Four times a day (QID) | INTRAMUSCULAR | Status: DC | PRN

## 2019-05-11 MED ORDER — BUTORPHANOL TARTRATE 2 MG/ML IJ SOLN: 2.0000 mg | INTRAMUSCULAR | Status: DC | PRN

## 2019-05-11 MED ORDER — SOD CITRATE-CITRIC ACID 500-334 MG/5ML PO SOLN: 30.0000 mL | ORAL | Status: DC | PRN

## 2019-05-11 MED ORDER — LACTATED RINGERS IV SOLN: INTRAVENOUS | Status: DC

## 2019-05-11 MED ORDER — LIDOCAINE HCL (PF) 1 % IJ SOLN: 30.0000 mL | INTRAMUSCULAR | Status: AC | PRN

## 2019-05-11 MED ORDER — AMMONIA AROMATIC IN INHA
RESPIRATORY_TRACT | Status: AC
  Filled 2019-05-11: qty 10

## 2019-05-11 MED ORDER — WITCH HAZEL-GLYCERIN EX PADS: 1.0000 "application " | MEDICATED_PAD | CUTANEOUS | Status: DC | PRN

## 2019-05-11 MED ORDER — MAGNESIUM HYDROXIDE 400 MG/5ML PO SUSP: 30.0000 mL | ORAL | Status: DC | PRN

## 2019-05-11 MED ORDER — BENZOCAINE-MENTHOL 20-0.5 % EX AERO
1.0000 "application " | INHALATION_SPRAY | CUTANEOUS | Status: DC | PRN
  Filled 2019-05-11: qty 56

## 2019-05-11 MED ORDER — PRENATAL MULTIVITAMIN CH
1.0000 | ORAL_TABLET | Freq: Every day | ORAL | Status: DC
  Administered 2019-05-11 – 2019-05-13 (×3): 1 via ORAL
  Filled 2019-05-11 (×3): qty 1

## 2019-05-11 MED ORDER — BUTORPHANOL TARTRATE 1 MG/ML IJ SOLN
INTRAMUSCULAR | Status: AC
  Administered 2019-05-11: 1 mg via INTRAVENOUS
  Filled 2019-05-11: qty 1

## 2019-05-11 MED ORDER — BUTORPHANOL TARTRATE 1 MG/ML IJ SOLN: 1.0000 mg | INTRAMUSCULAR | Status: DC | PRN

## 2019-05-11 MED ORDER — DIPHENHYDRAMINE HCL 25 MG PO CAPS: 25.0000 mg | ORAL_CAPSULE | Freq: Four times a day (QID) | ORAL | Status: DC | PRN

## 2019-05-11 MED ORDER — MISOPROSTOL 200 MCG PO TABS
ORAL_TABLET | ORAL | Status: AC
  Filled 2019-05-11: qty 4

## 2019-05-11 MED ORDER — LACTATED RINGERS IV SOLN: 500.0000 mL | INTRAVENOUS | Status: DC | PRN

## 2019-05-11 MED ORDER — LIDOCAINE HCL (PF) 1 % IJ SOLN: 30.0000 mL | INTRAMUSCULAR | Status: DC | PRN

## 2019-05-11 MED ORDER — COCONUT OIL OIL: 1.0000 "application " | TOPICAL_OIL | Status: DC | PRN

## 2019-05-11 MED ORDER — DIBUCAINE (PERIANAL) 1 % EX OINT: 1.0000 "application " | TOPICAL_OINTMENT | CUTANEOUS | Status: DC | PRN

## 2019-05-11 MED ORDER — OXYTOCIN 40 UNITS IN NORMAL SALINE INFUSION - SIMPLE MED
INTRAVENOUS | Status: AC
  Administered 2019-05-11: 500 mL via INTRAVENOUS
  Filled 2019-05-11: qty 1000

## 2019-05-11 MED ORDER — SIMETHICONE 80 MG PO CHEW: 80.0000 mg | CHEWABLE_TABLET | ORAL | Status: DC | PRN

## 2019-05-11 MED ORDER — OXYTOCIN 10 UNIT/ML IJ SOLN
INTRAMUSCULAR | Status: AC
  Filled 2019-05-11: qty 2

## 2019-05-11 MED ORDER — ONDANSETRON HCL 4 MG/2ML IJ SOLN: 4.0000 mg | INTRAMUSCULAR | Status: DC | PRN

## 2019-05-11 MED ORDER — ZOLPIDEM TARTRATE 5 MG PO TABS: 5.0000 mg | ORAL_TABLET | Freq: Every evening | ORAL | Status: DC | PRN

## 2019-05-11 MED ORDER — MEASLES, MUMPS & RUBELLA VAC IJ SOLR
0.5000 mL | Freq: Once | INTRAMUSCULAR | Status: DC
  Filled 2019-05-11: qty 0.5

## 2019-05-11 NOTE — Discharge Summary (Signed)
Obstetrical Discharge Summary  Patient Name: Kathy Hardin DOB: 07-13-1990 MRN: [id number]  Date of Admission: 05/11/2019 Date of Delivery: 05/11/2019 Delivered by: Huel Cote MD Date of Discharge: 05/13/19 Primary OB: [place]  [id number] last menstrual period was 08/15/2018 (exact date). EDC Estimated Date of Delivery: 05/22/19 Gestational Age at Delivery: [redacted]w[redacted]d   Antepartum complications: as below, + covid 03/27/2019 Admitting Diagnosis:  Secondary Diagnosis: Patient Active Problem List   Diagnosis Date Noted  . Encounter for elective induction of labor 05/11/2019  . Uterine contractions during pregnancy 05/11/2019  . IDA (iron deficiency anemia) 04/06/2019  . Encounter for supervision of normal first pregnancy in first trimester 01/23/2019  . NVD (normal vaginal delivery) 10/31/2010    Augmentation: AROM Complications: None Intrapartum complications/course: see delivery notes Date of Delivery: 05/11/2019 Delivered By: Huel Cote MD Delivery Type: spontaneous vaginal delivery Anesthesia: epidural Placenta: spontaneous Laceration: 2nd degree perineal Episiotomy: none Newborn Data: Live born female  Birth Weight: 3310g 7lb4.8oz  APGAR: 8, 9  Newborn Delivery   Birth date/time: 05/11/2019 06:06:00 Delivery type: Vaginal, Spontaneous      Postpartum Procedures: BTL on PPD1  Post partum course:  Patient had an uncomplicated postpartum course.  By time of discharge on PPD#2, her pain was controlled on oral pain medications; she had appropriate lochia and was ambulating, voiding without difficulty and tolerating regular diet.  She was deemed stable for discharge to home.     Discharge Physical Exam:  BP 114/75 (BP Location: Left Arm)   Pulse 81   Temp 97.6 F (36.4 C) (Oral)   Resp 18   Ht 5\' 5"  (1.651 m)   Wt 93 kg   LMP 08/15/2018 (Exact Date)   SpO2 100%   Breastfeeding Unknown   BMI 34.11 kg/m   General: NAD CV: RRR Pulm: CTABL, nl  effort ABD: s/nd/nt, fundus firm and below the umbilicus Lochia: moderate Incision: c/d/i DVT Evaluation: LE non-ttp, no evidence of DVT on exam.  Hemoglobin  Date Value Ref Range Status  05/12/2019 8.7 (L) 12.0 - 15.0 g/dL Final    Comment:    Reticulocyte Hemoglobin testing may be clinically indicated, consider ordering this additional test [id number]    HGB  Date Value Ref Range Status  02/27/2014 9.5 (L) 12.0 - 16.0 g/dL Final   HCT  Date Value Ref Range Status  05/12/2019 26.8 (L) 36.0 - 46.0 % Final  02/27/2014 29.5 (L) 35.0 - 47.0 % Final     Disposition: stable, discharge to home. Baby Feeding: breastmilk+formula Baby Disposition: home with mom  Rh Immune globulin given: mother O+   Rubella vaccine given: n/a, varicella n/a Tdap vaccine given in AP or PP setting: declined antenatal Flu vaccine given in AP or PP setting: declined antenatal  Contraception: PP BTL 05/12/2019  Prenatal Labs:  ABO, Rh: --/--/O POS (02/04 0501) Antibody: NEG (02/04 0501) Rubella:  Imm / Varicella Imm RPR:   nr HBsAg:   neg HIV:   neg GBS:   neg   Plan:  Kathy Hardin was discharged to home in good condition. Follow-up appointment with delivering provider in 6 weeks.  Discharge Medications: Allergies as of 05/13/2019      Reactions   Aspirin-acetaminophen-caffeine    Aspirin Hives   Oxycodone Hives      Medication List    STOP taking these medications   estrogens conjugated (synthetic A) 1.25 MG tablet Commonly known as: CENESTIN   ferrous sulfate 325 (65 FE) MG EC tablet Replaced by:  ferrous sulfate 325 (65 FE) MG tablet     TAKE these medications   acetaminophen 325 MG tablet Commonly known as: Tylenol Take 2 tablets (650 mg total) by mouth every 4 (four) hours as needed (for pain scale < 4).   ferrous sulfate 325 (65 FE) MG tablet Take 1 tablet (325 mg total) by mouth 2 (two) times daily with a meal. Replaces: ferrous sulfate 325 (65 FE) MG EC tablet    ibuprofen 600 MG tablet Commonly known as: ADVIL Take 1 tablet (600 mg total) by mouth every 6 (six) hours.   PRENATAL VITAMIN PLUS LOW IRON PO Take by mouth.       Follow-up Information    Schermerhorn, Gwen Her, MD Follow up in 2 week(s).   Specialty: Obstetrics and Gynecology Why: video visit 2wk Postop and 6wk PP visit.  Contact information: [address] [place] [address] [phone]           Signed: Francetta Found, CNM 05/13/2019 9:09 AM

## 2019-05-11 NOTE — H&P (Signed)
Kathy Hardin is a 29 y.o. female presenting for active labor   + covid 03/27/2019. OB History    Gravida  4   Para  3   Term  3   Preterm      AB      Living  3     SAB      TAB      Ectopic      Multiple      Live Births  3          Past Medical History:  Diagnosis Date  . IDA (iron deficiency anemia) 04/06/2019  . NVD (normal vaginal delivery) 10/31/2010   Past Surgical History:  Procedure Laterality Date  . NO PAST SURGERIES     Family History: Family history is unknown by patient. Social History:  reports that she has never smoked. She has never used smokeless tobacco. She reports that she does not drink alcohol or use drugs.     Maternal Diabetes: No Genetic Screening: Normal Maternal Ultrasounds/Referrals: Normal Fetal Ultrasounds or other Referrals:  None Maternal Substance Abuse:  No Significant Maternal Medications:  None Significant Maternal Lab Results:  Group B Strep negative Other Comments:  None  Review of Systems  Review of Systems: A full review of systems was performed and negative except as noted in the HPI.   Eyes: no vision change  Ears: left ear pain  Oropharynx: no sore throat  Pulmonary . No shortness of breath , no hemoptysis Cardiovascular: no chest pain , no irregular heart beat  Gastrointestinal:no blood in stool . No diarrhea, no constipation Uro gynecologic: no dysuria , no pelvic pain Neurologic : no seizure , no migraines    Musculoskeletal: no muscular weakness History Dilation: Lip/rim Effacement (%): 100 Station: -2 Exam by:: Lillia Lengel MD Blood pressure 125/83, pulse 100, temperature 98.4 F (36.9 C), temperature source Oral, resp. rate 18, height 5\' 5"  (1.651 m), weight 93 kg, last menstrual period 08/15/2018, unknown if currently breastfeeding. Exam Physical Exam  Lungs CTA  cv rrr Prenatal labs: ABO, Rh: --/--/O POS (02/04 0501) Antibody: NEG (02/04 0501) Rubella:  Imm / Varicella Imm RPR:    nr HBsAg:   neg HIV:   neg GBS:   neg  Assessment/Plan: Admit for labor  Anticipate SVD   Kathy Hardin 05/11/2019, 6:57 AM

## 2019-05-11 NOTE — OB Triage Note (Signed)
Pt is a 28y/o G4P3 at [redacted]w[redacted]d with c/o ctx that began at 230 rating 8/10 . Pt states +FM. Pt denies LOF and VB. Monitors applied and assessing. Initial FHT 125.

## 2019-05-12 ENCOUNTER — Encounter: Payer: Self-pay | Admitting: Obstetrics and Gynecology

## 2019-05-12 ENCOUNTER — Inpatient Hospital Stay: Payer: Medicaid Other

## 2019-05-12 ENCOUNTER — Encounter
Admission: EM | Disposition: A | Discharge status: Home / Self Care | Payer: Self-pay | Source: Home / Self Care | Attending: Obstetrics and Gynecology

## 2019-05-12 DIAGNOSIS — Z9851 Tubal ligation status: Secondary | ICD-10-CM | POA: Insufficient documentation

## 2019-05-12 HISTORY — PX: TUBAL LIGATION: SHX77

## 2019-05-12 HISTORY — DX: Tubal ligation status: Z98.51

## 2019-05-12 LAB — CBC
HCT: 26.8 % — ABNORMAL LOW (ref 36.0–46.0)
Hemoglobin: 8.7 g/dL — ABNORMAL LOW (ref 12.0–15.0)
MCH: 25 pg — ABNORMAL LOW (ref 26.0–34.0)
MCHC: 32.5 g/dL (ref 30.0–36.0)
MCV: 77 fL — ABNORMAL LOW (ref 80.0–100.0)
Platelets: 219 10*3/uL (ref 150–400)
RBC: 3.48 MIL/uL — ABNORMAL LOW (ref 3.87–5.11)
RDW: 15.7 % — ABNORMAL HIGH (ref 11.5–15.5)
WBC: 8.7 10*3/uL (ref 4.0–10.5)
nRBC: 0 % (ref 0.0–0.2)

## 2019-05-12 SURGERY — LIGATION, FALLOPIAN TUBE, POSTPARTUM
Anesthesia: Spinal | Laterality: Bilateral

## 2019-05-12 MED ORDER — PROPOFOL 10 MG/ML IV BOLUS
INTRAVENOUS | Status: AC
  Filled 2019-05-12: qty 20

## 2019-05-12 MED ORDER — FENTANYL CITRATE (PF) 100 MCG/2ML IJ SOLN: 25.0000 ug | INTRAMUSCULAR | Status: DC | PRN

## 2019-05-12 MED ORDER — PROMETHAZINE HCL 25 MG/ML IJ SOLN: 6.2500 mg | INTRAMUSCULAR | Status: DC | PRN

## 2019-05-12 MED ORDER — FENTANYL CITRATE (PF) 100 MCG/2ML IJ SOLN
INTRAMUSCULAR | Status: AC
  Filled 2019-05-12: qty 2

## 2019-05-12 MED ORDER — PROPOFOL 10 MG/ML IV BOLUS
INTRAVENOUS | Status: DC | PRN
  Administered 2019-05-12: 30 mg via INTRAVENOUS

## 2019-05-12 MED ORDER — GLYCOPYRROLATE 0.2 MG/ML IJ SOLN
INTRAMUSCULAR | Status: DC | PRN
  Administered 2019-05-12: .2 mg via INTRAVENOUS

## 2019-05-12 MED ORDER — IBUPROFEN 600 MG PO TABS
600.0000 mg | ORAL_TABLET | Freq: Four times a day (QID) | ORAL | Status: DC
  Administered 2019-05-12 – 2019-05-13 (×3): 600 mg via ORAL
  Filled 2019-05-12 (×3): qty 1

## 2019-05-12 MED ORDER — MIDAZOLAM HCL 2 MG/2ML IJ SOLN
INTRAMUSCULAR | Status: AC
  Filled 2019-05-12: qty 2

## 2019-05-12 MED ORDER — HYDROCODONE-ACETAMINOPHEN 5-325 MG PO TABS
1.0000 | ORAL_TABLET | ORAL | Status: DC | PRN
  Administered 2019-05-12 – 2019-05-13 (×4): 1 via ORAL
  Filled 2019-05-12: qty 1

## 2019-05-12 MED ORDER — LIDOCAINE HCL (PF) 1 % IJ SOLN
INTRAMUSCULAR | Status: DC | PRN
  Administered 2019-05-12: 3 mL via SUBCUTANEOUS

## 2019-05-12 MED ORDER — BUPIVACAINE HCL (PF) 0.75 % IJ SOLN
INTRAMUSCULAR | Status: DC | PRN
  Administered 2019-05-12: 2 mL via INTRATHECAL

## 2019-05-12 MED ORDER — BUPIVACAINE HCL (PF) 0.5 % IJ SOLN
INTRAMUSCULAR | Status: AC
  Filled 2019-05-12: qty 30

## 2019-05-12 MED ORDER — ONDANSETRON 4 MG PO TBDP: 4.0000 mg | ORAL_TABLET | Freq: Four times a day (QID) | ORAL | Status: DC | PRN

## 2019-05-12 MED ORDER — MIDAZOLAM HCL 5 MG/5ML IJ SOLN
INTRAMUSCULAR | Status: DC | PRN
  Administered 2019-05-12: 2 mg via INTRAVENOUS

## 2019-05-12 MED ORDER — BUPIVACAINE HCL 0.5 % IJ SOLN
INTRAMUSCULAR | Status: DC | PRN
  Administered 2019-05-12: 4 mL

## 2019-05-12 MED ORDER — DEXAMETHASONE SODIUM PHOSPHATE 10 MG/ML IJ SOLN
INTRAMUSCULAR | Status: DC | PRN
  Administered 2019-05-12: 10 mg via INTRAVENOUS

## 2019-05-12 MED ORDER — PROPOFOL 500 MG/50ML IV EMUL
INTRAVENOUS | Status: DC | PRN
  Administered 2019-05-12: 75 ug/kg/min via INTRAVENOUS

## 2019-05-12 MED ORDER — BUPIVACAINE IN DEXTROSE 0.75-8.25 % IT SOLN
INTRATHECAL | Status: DC | PRN
  Administered 2019-05-12: 2 mL via INTRATHECAL

## 2019-05-12 MED ORDER — ONDANSETRON HCL 4 MG/2ML IJ SOLN
INTRAMUSCULAR | Status: DC | PRN
  Administered 2019-05-12: 4 mg via INTRAVENOUS

## 2019-05-12 MED ORDER — LIDOCAINE HCL (CARDIAC) PF 100 MG/5ML IV SOSY
PREFILLED_SYRINGE | INTRAVENOUS | Status: DC | PRN
  Administered 2019-05-12: 60 mg via INTRAVENOUS

## 2019-05-12 MED ORDER — GLYCOPYRROLATE 0.2 MG/ML IJ SOLN
INTRAMUSCULAR | Status: AC
  Filled 2019-05-12: qty 1

## 2019-05-12 SURGICAL SUPPLY — 35 items
APPLICATOR COTTON TIP 6IN STRL (MISCELLANEOUS) IMPLANT
BLADE SURG SZ11 CARB STEEL (BLADE) IMPLANT
CANISTER SUCT 1200ML W/VALVE (MISCELLANEOUS) ×3 IMPLANT
CHLORAPREP W/TINT 26 (MISCELLANEOUS) ×3 IMPLANT
CLOSURE WOUND 1/4X4 (GAUZE/BANDAGES/DRESSINGS)
COVER WAND RF STERILE (DRAPES) IMPLANT
DERMABOND ADVANCED (GAUZE/BANDAGES/DRESSINGS) ×2
DERMABOND ADVANCED .7 DNX12 (GAUZE/BANDAGES/DRESSINGS) ×1 IMPLANT
DRAPE LAPAROTOMY 77X122 PED (DRAPES) ×3 IMPLANT
DRSG TEGADERM 2-3/8X2-3/4 SM (GAUZE/BANDAGES/DRESSINGS) IMPLANT
DRSG TEGADERM 4X4.75 (GAUZE/BANDAGES/DRESSINGS) IMPLANT
ELECT CAUTERY BLADE 6.4 (BLADE) ×3 IMPLANT
ELECT REM PT RETURN 9FT ADLT (ELECTROSURGICAL) ×3
ELECTRODE REM PT RTRN 9FT ADLT (ELECTROSURGICAL) ×1 IMPLANT
GLOVE BIO SURGEON STRL SZ8 (GLOVE) ×9 IMPLANT
GLOVE BIOGEL PI IND STRL 7.5 (GLOVE) ×3 IMPLANT
GLOVE BIOGEL PI INDICATOR 7.5 (GLOVE) ×6
GOWN STRL REUS W/ TWL LRG LVL3 (GOWN DISPOSABLE) ×2 IMPLANT
GOWN STRL REUS W/ TWL XL LVL3 (GOWN DISPOSABLE) ×1 IMPLANT
GOWN STRL REUS W/TWL LRG LVL3 (GOWN DISPOSABLE) ×4
GOWN STRL REUS W/TWL XL LVL3 (GOWN DISPOSABLE) ×2
KIT TURNOVER KIT A (KITS) ×3 IMPLANT
LABEL OR SOLS (LABEL) ×3 IMPLANT
NEEDLE HYPO 22GX1.5 SAFETY (NEEDLE) ×3 IMPLANT
NS IRRIG 500ML POUR BTL (IV SOLUTION) ×3 IMPLANT
PACK BASIN MINOR ARMC (MISCELLANEOUS) ×3 IMPLANT
SPONGE GAUZE 2X2 8PLY STER LF (GAUZE/BANDAGES/DRESSINGS) ×1
SPONGE GAUZE 2X2 8PLY STRL LF (GAUZE/BANDAGES/DRESSINGS) ×2 IMPLANT
STRIP CLOSURE SKIN 1/4X4 (GAUZE/BANDAGES/DRESSINGS) IMPLANT
SUT PLAIN GUT 0 (SUTURE) ×9 IMPLANT
SUT VIC AB 2-0 UR6 27 (SUTURE) ×3 IMPLANT
SUT VIC AB 4-0 SH 27 (SUTURE) ×2
SUT VIC AB 4-0 SH 27XANBCTRL (SUTURE) ×1 IMPLANT
SWABSTK COMLB BENZOIN TINCTURE (MISCELLANEOUS) IMPLANT
SYR 10ML LL (SYRINGE) ×3 IMPLANT

## 2019-05-12 NOTE — Anesthesia Procedure Notes (Signed)
Procedure Name: MAC Date/Time: 05/12/2019 8:23 AM Performed by: Nolon Lennert, RN Pre-anesthesia Checklist: Patient identified, Emergency Drugs available, Suction available, Patient being monitored and Timeout performed Oxygen Delivery Method: Nasal cannula

## 2019-05-12 NOTE — Op Note (Signed)
NAME: Kathy Hardin, KALISCH MEDICAL RECORD [id number] ACCOUNT [id number] DATE OF BIRTH:1990-10-08 FACILITY: [facility] LOCATION: [facility] PHYSICIAN:Obed Samek Josefine Class, MD  OPERATIVE REPORT  DATE OF PROCEDURE:  05/12/2019  PREOPERATIVE DIAGNOSIS:  Elective permanent sterilization.  POSTOPERATIVE DIAGNOSIS:  Elective permanent sterilization, postpartum.  PROCEDURE:  Postpartum bilateral tubal ligation -- [place].  SURGEON:  Laverta Baltimore, MD  ANESTHESIA:  Spinal.  INDICATIONS:  Gravida 4, now para 4, status post uncomplicated spontaneous vaginal delivery the day previously.  The patient has committed to elective permanent sterilization.  Medicaid consent form signed greater than 30 days prior to the procedure.   The patient reconfirms her desire to undergo sterilization the day of the procedure.  The patient is aware of the failure rate of 1 per 300.  DESCRIPTION OF PROCEDURE:  After adequate spinal anesthesia, the patient was placed in the dorsal supine position.  The patient's abdomen was prepped and draped in normal sterile fashion.  Timeout was performed.  A 15 mm infraumbilical incision was made  after injecting with 0.5% Marcaine.  Fascia was opened sharply and the peritoneum was opened sharply without difficulty.  Attention was directed to the patient's right uterine side where the right fallopian tube was grasped with a Babcock clamp.  The  fimbriated end was visualized and at the midportion of the fallopian tube, 2 separate 0 plain gut sutures were placed and a 1.5 cm portion of fallopian tube was removed.  Good hemostasis was noted.  Attention was then directed to the patient's left  uterine side where the left fallopian tube was identified.  The fimbriated end was visualized.  At the midportion of the left fallopian tube, 2 separate 0 plain gut sutures were placed and a 1.5 cm portion of fallopian tube was removed.  Good hemostasis  was noted.  Each portion of fallopian tube  will be sent to pathology for identification.  Good hemostasis was noted.  The fascia was then closed with a 2-0 Vicryl suture in a running nonlocking fashion and the skin was reapproximated with a subcuticular  4-0 Vicryl suture.   There were no complications.  ESTIMATED BLOOD LOSS:  Less than 5 mL.  INTRAOPERATIVE FLUIDS:  550 mL.  DISPOSITION:  The patient tolerated the procedure well and was taken to recovery room in good condition.  VN/NUANCE  D:05/12/2019 T:05/12/2019 JOB:[id number]

## 2019-05-12 NOTE — Anesthesia Preprocedure Evaluation (Signed)
Anesthesia Evaluation  Patient identified by MRN, date of birth, ID band Patient awake    Reviewed: Allergy & Precautions, H&P , NPO status , Patient's Chart, lab work & pertinent test results, reviewed documented beta blocker date and time   History of Anesthesia Complications Negative for: history of anesthetic complications  Airway Mallampati: II  TM Distance: >3 FB Neck ROM: full    Dental  (+) Dental Advidsory Given   Pulmonary neg pulmonary ROS,    Pulmonary exam normal        Cardiovascular Exercise Tolerance: Good negative cardio ROS Normal cardiovascular exam     Neuro/Psych negative neurological ROS  negative psych ROS   GI/Hepatic negative GI ROS, Neg liver ROS,   Endo/Other  negative endocrine ROS  Renal/GU negative Renal ROS  negative genitourinary   Musculoskeletal   Abdominal   Peds  Hematology  (+) Blood dyscrasia, anemia ,   Anesthesia Other Findings Past Medical History: 04/06/2019: IDA (iron deficiency anemia) 10/31/2010: NVD (normal vaginal delivery)   Reproductive/Obstetrics negative OB ROS                             Anesthesia Physical Anesthesia Plan  ASA: I  Anesthesia Plan: Spinal   Post-op Pain Management:    Induction:   PONV Risk Score and Plan: 2  Airway Management Planned: Natural Airway and Simple Face Mask  Additional Equipment:   Intra-op Plan:   Post-operative Plan:   Informed Consent: I have reviewed the patients History and Physical, chart, labs and discussed the procedure including the risks, benefits and alternatives for the proposed anesthesia with the patient or authorized representative who has indicated his/her understanding and acceptance.     Dental Advisory Given  Plan Discussed with: Anesthesiologist, CRNA and Surgeon  Anesthesia Plan Comments:         Anesthesia Quick Evaluation

## 2019-05-12 NOTE — Anesthesia Procedure Notes (Addendum)
Spinal  Patient location during procedure: OR Start time: 05/12/2019 8:21 AM End time: 05/12/2019 8:23 AM Staffing Performed: other anesthesia staff  Anesthesiologist: Martha Clan, MD Resident/CRNA: Johnna Acosta, CRNA Other anesthesia staff: Nolon Lennert, RN Preanesthetic Checklist Completed: patient identified, IV checked, site marked, risks and benefits discussed, surgical consent, monitors and equipment checked, pre-op evaluation and timeout performed Spinal Block Patient position: sitting Prep: DuraPrep Patient monitoring: heart rate, cardiac monitor, continuous pulse ox and blood pressure Approach: midline Location: L3-4 Injection technique: single-shot Needle Needle type: Sprotte  Needle gauge: 24 G Needle length: 9 cm Assessment Sensory level: T4 Additional Notes IV functioning, monitors applied to pt. Expiration date of kit checked and confirmed to be in date. Sterile prep and drape, hand hygiene and sterile gloved used. Pt was positioned and spine was prepped in sterile fashion. Skin was anesthetized with lidocaine. Free flow of clear CSF obtained prior to injecting local anesthetic into CSF x 1 attempt. Spinal needle aspirated freely following injection. Needle was carefully withdrawn, and pt tolerated procedure well. Loss of motor and sensory on exam post injection.

## 2019-05-12 NOTE — Transfer of Care (Signed)
Immediate Anesthesia Transfer of Care Note  Patient: Kathy Hardin  Procedure(s) Performed: POST PARTUM TUBAL LIGATION (Bilateral )  Patient Location: PACU  Anesthesia Type:Spinal  Level of Consciousness: drowsy  Airway & Oxygen Therapy: Patient Spontanous Breathing and Patient connected to nasal cannula oxygen  Post-op Assessment: Report given to RN and Post -op Vital signs reviewed and stable  Post vital signs: Reviewed and stable  Last Vitals:  Vitals Value Taken Time  BP 103/54 05/12/19 0913  Temp 36.6 C 05/12/19 0913  Pulse 77 05/12/19 0915  Resp 15 05/12/19 0915  SpO2 99 % 05/12/19 0915  Vitals shown include unvalidated device data.  Last Pain:  Vitals:   05/12/19 0913  TempSrc:   PainSc: Asleep         Complications: No apparent anesthesia complications

## 2019-05-12 NOTE — Progress Notes (Signed)
elective sterilization .. reconfirms desire this am . Labs reviewed . All questions answered

## 2019-05-12 NOTE — Progress Notes (Signed)
Pt transported to OR for postpartum tubal ligation at this time.   Hilbert Bible, RN

## 2019-05-12 NOTE — Progress Notes (Signed)
Post Partum Day 1  Subjective: Doing well, no concerns. Ambulating without difficulty, pain managed with PO meds, tolerating regular diet, and voiding without difficulty.   No fever/chills, chest pain, shortness of breath, nausea/vomiting, or leg pain. No nipple or breast pain.   Objective: BP 130/88 (BP Location: Left Arm)   Pulse 69   Temp 97.9 F (36.6 C) (Oral)   Resp 18   Ht 5\' 5"  (1.651 m)   Wt 93 kg   LMP 08/15/2018 (Exact Date)   SpO2 100%   Breastfeeding Unknown   BMI 34.11 kg/m    Physical Exam:  General: alert, cooperative, appears stated age and no distress Breasts: soft/nontender CV: RRR Pulm: nl effort, CTABL Abdomen: soft, non-tender, active bowel sounds Uterine Fundus: soft Incision: no significant drainage Lochia: appropriate DVT Evaluation: No evidence of DVT seen on physical exam. No cords or calf tenderness. No significant calf/ankle edema.  Recent Labs    05/11/19 0501 05/12/19 0454  HGB 10.7* 8.7*  HCT 32.4* 26.8*  WBC 6.9 8.7  PLT 225 219    Assessment/Plan: 29 y.o. G4P4004 postpartum day # 1  -Continue routine postpartum care -Encouraged snug fitting bra, cold application, Tylenol PRN, and cabbage leaves for engorgement for formula feeding  -S/p BTL today for contraception -Acute blood loss anemia - hemodynamically stable and asymptomatic; start PO ferrous sulfate BID with stool softeners  -Immunization status:  all immunizations up to date  Disposition: Continue inpatient postpartum care    LOS: 1 day   Lisette Grinder, CNM 05/12/2019, 1:12 PM   ----- Lisette Grinder Certified Nurse Midwife [facility]

## 2019-05-12 NOTE — Brief Op Note (Signed)
05/12/2019  9:06 AM  PATIENT:  Kathy Hardin  29 y.o. female  PRE-OPERATIVE DIAGNOSIS:  Desires Sterilization, postpartum  POST-OPERATIVE DIAGNOSIS:  desires permanent sterilization  PROCEDURE:  Procedure(s): POST PARTUM TUBAL LIGATION (Bilateral)  SURGEON:  Surgeon(s) and Role:    * Alejandra Barna, Gwen Her, MD - Primary  PHYSICIAN ASSISTANT: CSt  ASSISTANTS: none   ANESTHESIA:   spinal  EBL:  5 mL IOF 550 cc  BLOOD ADMINISTERED:none  DRAINS: none   LOCAL MEDICATIONS USED:  MARCAINE     SPECIMEN:  Source of Specimen:  portion right and left fallopian tube  DISPOSITION OF SPECIMEN:  PATHOLOGY  COUNTS:  YES  TOURNIQUET:  * No tourniquets in log *  DICTATION: .Other Dictation: Dictation Number verbal  PLAN OF CARE: return to floor- already inpatient  PATIENT DISPOSITION:  PACU - hemodynamically stable.   Delay start of Pharmacological VTE agent (>24hrs) due to surgical blood loss or risk of bleeding: not applicable

## 2019-05-13 MED ORDER — FERROUS SULFATE 325 (65 FE) MG PO TABS: 325.0000 mg | ORAL_TABLET | Freq: Two times a day (BID) | ORAL | 1 refills | Status: DC

## 2019-05-13 MED ORDER — IBUPROFEN 600 MG PO TABS: 600.0000 mg | ORAL_TABLET | Freq: Four times a day (QID) | ORAL | 0 refills | Status: DC

## 2019-05-13 MED ORDER — ACETAMINOPHEN 325 MG PO TABS: 650.0000 mg | ORAL_TABLET | ORAL | Status: DC | PRN

## 2019-05-13 NOTE — Progress Notes (Signed)
Reviewed D/C instructions with pt and family. Pt verbalized understanding of teaching. Discharged to home via W/C. Pt to schedule f/u appt.  

## 2019-05-13 NOTE — Discharge Instructions (Signed)
Postpartum Care After Vaginal Delivery °This sheet gives you information about how to care for yourself from the time you deliver your baby to up to 6-12 weeks after delivery (postpartum period). Your health care provider may also give you more specific instructions. If you have problems or questions, contact your health care provider. °Follow these instructions at home: °Vaginal bleeding °· It is normal to have vaginal bleeding (lochia) after delivery. Wear a sanitary pad for vaginal bleeding and discharge. °? During the first week after delivery, the amount and appearance of lochia is often similar to a menstrual period. °? Over the next few weeks, it will gradually decrease to a dry, yellow-brown discharge. °? For most women, lochia stops completely by 4-6 weeks after delivery. Vaginal bleeding can vary from woman to woman. °· Change your sanitary pads frequently. Watch for any changes in your flow, such as: °? A sudden increase in volume. °? A change in color. °? Large blood clots. °· If you pass a blood clot from your vagina, save it and call your health care provider to discuss. Do not flush blood clots down the toilet before talking with your health care provider. °· Do not use tampons or douches until your health care provider says this is safe. °· If you are not breastfeeding, your period should return 6-8 weeks after delivery. If you are feeding your child breast milk only (exclusive breastfeeding), your period may not return until you stop breastfeeding. °Perineal care °· Keep the area between the vagina and the anus (perineum) clean and dry as told by your health care provider. Use medicated pads and pain-relieving sprays and creams as directed. °· If you had a cut in the perineum (episiotomy) or a tear in the vagina, check the area for signs of infection until you are healed. Check for: °? More redness, swelling, or pain. °? Fluid or blood coming from the cut or tear. °? Warmth. °? Pus or a bad  smell. °· You may be given a squirt bottle to use instead of wiping to clean the perineum area after you go to the bathroom. As you start healing, you may use the squirt bottle before wiping yourself. Make sure to wipe gently. °· To relieve pain caused by an episiotomy, a tear in the vagina, or swollen veins in the anus (hemorrhoids), try taking a warm sitz bath 2-3 times a day. A sitz bath is a warm water bath that is taken while you are sitting down. The water should only come up to your hips and should cover your buttocks. °Breast care °· Within the first few days after delivery, your breasts may feel heavy, full, and uncomfortable (breast engorgement). Milk may also leak from your breasts. Your health care provider can suggest ways to help relieve the discomfort. Breast engorgement should go away within a few days. °· If you are breastfeeding: °? Wear a bra that supports your breasts and fits you well. °? Keep your nipples clean and dry. Apply creams and ointments as told by your health care provider. °? You may need to use breast pads to absorb milk that leaks from your breasts. °? You may have uterine contractions every time you breastfeed for up to several weeks after delivery. Uterine contractions help your uterus return to its normal size. °? If you have any problems with breastfeeding, work with your health care provider or lactation consultant. °· If you are not breastfeeding: °? Avoid touching your breasts a lot. Doing this can make   your breasts produce more milk. °? Wear a good-fitting bra and use cold packs to help with swelling. °? Do not squeeze out (express) milk. This causes you to make more milk. °Intimacy and sexuality °· Ask your health care provider when you can engage in sexual activity. This may depend on: °? Your risk of infection. °? How fast you are healing. °? Your comfort and desire to engage in sexual activity. °· You are able to get pregnant after delivery, even if you have not had  your period. If desired, talk with your health care provider about methods of birth control (contraception). °Medicines °· Take over-the-counter and prescription medicines only as told by your health care provider. °· If you were prescribed an antibiotic medicine, take it as told by your health care provider. Do not stop taking the antibiotic even if you start to feel better. °Activity °· Gradually return to your normal activities as told by your health care provider. Ask your health care provider what activities are safe for you. °· Rest as much as possible. Try to rest or take a nap while your baby is sleeping. °Eating and drinking ° °· Drink enough fluid to keep your urine pale yellow. °· Eat high-fiber foods every day. These may help prevent or relieve constipation. High-fiber foods include: °? Whole grain cereals and breads. °? Brown rice. °? Beans. °? Fresh fruits and vegetables. °· Do not try to lose weight quickly by cutting back on calories. °· Take your prenatal vitamins until your postpartum checkup or until your health care provider tells you it is okay to stop. °Lifestyle °· Do not use any products that contain nicotine or tobacco, such as cigarettes and e-cigarettes. If you need help quitting, ask your health care provider. °· Do not drink alcohol, especially if you are breastfeeding. °General instructions °· Keep all follow-up visits for you and your baby as told by your health care provider. Most women visit their health care provider for a postpartum checkup within the first 3-6 weeks after delivery. °Contact a health care provider if: °· You feel unable to cope with the changes that your child brings to your life, and these feelings do not go away. °· You feel unusually sad or worried. °· Your breasts become red, painful, or hard. °· You have a fever. °· You have trouble holding urine or keeping urine from leaking. °· You have little or no interest in activities you used to enjoy. °· You have not  breastfed at all and you have not had a menstrual period for 12 weeks after delivery. °· You have stopped breastfeeding and you have not had a menstrual period for 12 weeks after you stopped breastfeeding. °· You have questions about caring for yourself or your baby. °· You pass a blood clot from your vagina. °Get help right away if: °· You have chest pain. °· You have difficulty breathing. °· You have sudden, severe leg pain. °· You have severe pain or cramping in your lower abdomen. °· You bleed from your vagina so much that you fill more than one sanitary pad in one hour. Bleeding should not be heavier than your heaviest period. °· You develop a severe headache. °· You faint. °· You have blurred vision or spots in your vision. °· You have bad-smelling vaginal discharge. °· You have thoughts about hurting yourself or your baby. °If you ever feel like you may hurt yourself or others, or have thoughts about taking your own life, get help   right away. You can go to the nearest emergency department or call:  Your local emergency services (911 in the [place]  A suicide crisis helpline, such as the [facility] at 332-764-0995. This is open 24 hours a day. Summary  The period of time right after you deliver your newborn up to 6-12 weeks after delivery is called the postpartum period.  Gradually return to your normal activities as told by your health care provider.  Keep all follow-up visits for you and your baby as told by your health care provider. This information is not intended to replace advice given to you by your health care provider. Make sure you discuss any questions you have with your health care provider. Document Revised: 03/26/2017 Document Reviewed: 01/04/2017 Elsevier Patient Education  2020 [place].   Postpartum Tubal Ligation, Care After This sheet gives you information about how to care for yourself after your procedure. Your health care provider may  also give you more specific instructions. If you have problems or questions, contact your health care provider. What can I expect after the procedure? After the procedure, you may have:  A sore throat.  Bruising or pain in your back.  Nausea or vomiting.  Dizziness.  Mild abdominal discomfort or pain, such as cramping, gas pain, or feeling bloated.  Soreness around the incision area.  Tiredness.  Pain in your shoulders. Follow these instructions at home: Medicines  Ask your health care provider if the medicine prescribed to you: ? Requires you to avoid driving or using heavy machinery. ? Can cause constipation. You may need to take actions to prevent or treat constipation, such as:  Drink enough fluid to keep your urine pale yellow.  Take over-the-counter or prescription medicines.  Eat foods that are high in fiber, such as beans, whole grains, and fresh fruits and vegetables.  Limit foods that are high in fat and processed sugars, such as fried or sweet foods.  Do not take aspirin because it can cause bleeding. Activity  Rest for the remainder of the day.  Return to your normal activities as told by your health care provider. Ask your health care provider what activities are safe for you.  Do not have sex, douche, or put a tampon or anything else in your vagina for 6 weeks or as long as told by your health care provider.  Do not lift anything that is heavier than your baby for 2 weeks, or the limit that you are told, until your health care provider says that it is safe. Incision care      Follow instructions from your health care provider about how to take care of your incision. Make sure you: ? Wash your hands with soap and water before and after you change your bandage (dressing). If soap and water are not available, use hand sanitizer. ? Change your dressing as told by your health care provider. ? Leave stitches (sutures), skin glue, or adhesive strips in  place. These skin closures may need to stay in place for 2 weeks or longer. If adhesive strip edges start to loosen and curl up, you may trim the loose edges. Do not remove adhesive strips completely unless your health care provider tells you to do that.  Check your incision area every day for signs of infection. Check for: ? Redness, swelling, or pain. ? Fluid or blood. ? Warmth. ? Pus or a bad smell. Other Instructions  Do not take baths, swim, or use a hot  tub until your health care provider approves. Ask your health care provider if you may take showers. You may only be allowed to take sponge baths.  Keep all follow-up visits as told by your health care provider. This is important. Contact a health care provider if:  You have redness, swelling, or pain around your incision.  Your incision feels warm to the touch.  Your pain does not improve after 2-3 days.  You have a rash.  You repeatedly become dizzy or lightheaded.  Your pain medicine is not helping.  You are constipated. Get help right away if you:  Have a fever.  Faint.  Have pain in your abdomen that gets worse.  Have fluid or blood coming from your incision.  You have pus or a bad smell coming from your incision.  The edges of your incision break open after the sutures have been removed.  Have shortness of breath or trouble breathing.  Have chest pain or leg pain.  Have ongoing nausea or diarrhea. Summary  Mild abdominal discomfort is common after this procedure.  Contact your health care provider if you experience problems or have concerns.  Do not lift anything that is heavier than your baby for 2 weeks, or the limit that you are told, until your health care provider says that it is safe.  Keep all follow-up visits as told by your health care provider. This is important. This information is not intended to replace advice given to you by your health care provider. Make sure you discuss any questions  you have with your health care provider. Document Revised: 09/05/2018 Document Reviewed: 02/10/2018 Elsevier Patient Education  2020 [employer].   Postpartum Baby Blues The postpartum period begins right after the birth of a baby. During this time, there is often a lot of joy and excitement. It is also a time of many changes in the life of the parents. No matter how many times a mother gives birth, each child brings new challenges to the family, including different ways of relating to one another. It is common to have feelings of excitement along with confusing changes in moods, emotions, and thoughts. You may feel happy one minute and sad or stressed the next. These feelings of sadness usually happen in the period right after you have your baby, and they go away within a week or two. This is called the "baby blues." What are the causes? There is no known cause of baby blues. It is likely caused by a combination of factors. However, changes in hormone levels after childbirth are believed to trigger some of the symptoms. Other factors that can play a role in these mood changes include:  Lack of sleep.  Stressful life events, such as poverty, caring for a loved one, or death of a loved one.  Genetics. What are the signs or symptoms? Symptoms of this condition include:  Brief changes in mood, such as going from extreme happiness to sadness.  Decreased concentration.  Difficulty sleeping.  Crying spells and tearfulness.  Loss of appetite.  Irritability.  Anxiety. If the symptoms of baby blues last for more than 2 weeks or become more severe, you may have postpartum depression. How is this diagnosed? This condition is diagnosed based on an evaluation of your symptoms. There are no medical or lab tests that lead to a diagnosis, but there are various questionnaires that a health care provider may use to identify women with the baby blues or postpartum depression. How is this  treated? Treatment is not needed for this condition. The baby blues usually go away on their own in 1-2 weeks. Social support is often all that is needed. You will be encouraged to get adequate sleep and rest. Follow these instructions at home: Lifestyle      Get as much rest as you can. Take a nap when the baby sleeps.  Exercise regularly as told by your health care provider. Some women find yoga and walking to be helpful.  Eat a balanced and nourishing diet. This includes plenty of fruits and vegetables, whole grains, and lean proteins.  Do little things that you enjoy. Have a cup of tea, take a bubble bath, read your favorite magazine, or listen to your favorite music.  Avoid alcohol.  Ask for help with household chores, cooking, grocery shopping, or running errands. Do not try to do everything yourself. Consider hiring a postpartum doula to help. This is a professional who specializes in providing support to new mothers.  Try not to make any major life changes during pregnancy or right after giving birth. This can add stress. General instructions  Talk to people close to you about how you are feeling. Get support from your partner, family members, friends, or other new moms. You may want to join a support group.  Find ways to cope with stress. This may include: ? Writing your thoughts and feelings in a journal. ? Spending time outside. ? Spending time with people who make you laugh.  Try to stay positive in how you think. Think about the things you are grateful for.  Take over-the-counter and prescription medicines only as told by your health care provider.  Let your health care provider know if you have any concerns.  Keep all postpartum visits as told by your health care provider. This is important. Contact a health care provider if:  Your baby blues do not go away after 2 weeks. Get help right away if:  You have thoughts of taking your own life (suicidal  thoughts).  You think you may harm the baby or other people.  You see or hear things that are not there (hallucinations). Summary  After giving birth, you may feel happy one minute and sad or stressed the next. Feelings of sadness that happen right after the baby is born and go away after a week or two are called the "baby blues."  You can manage the baby blues by getting enough rest, eating a healthy diet, exercising, spending time with supportive people, and finding ways to cope with stress.  If feelings of sadness and stress last longer than 2 weeks or get in the way of caring for your baby, talk to your health care provider. This may mean you have postpartum depression. This information is not intended to replace advice given to you by your health care provider. Make sure you discuss any questions you have with your health care provider. Document Revised: 07/15/2018 Document Reviewed: 05/19/2016 Elsevier Patient Education  [place].

## 2019-05-13 NOTE — Plan of Care (Signed)
Vs stable; up ad lib; tolerating regular diet; taking motrin and norco for pain control; had BTL on 05-12-19; the dressing has drainage on it that is old and marked; the area has not increased; pt ambulates well in the room

## 2019-05-13 NOTE — Progress Notes (Signed)
Post Partum Day 2 Subjective: Doing well, no complaints.  Tolerating regular diet, pain with PO meds, voiding and ambulating without difficulty.  No CP SOB Fever,Chills, N/V or leg pain; denies nipple or breast pain, no HA change of vision, RUQ/epigastric pain  Objective: BP 114/75 (BP Location: Left Arm)   Pulse 81   Temp 97.6 F (36.4 C) (Oral)   Resp 18   Ht 5\' 5"  (1.651 m)   Wt 93 kg   LMP 08/15/2018 (Exact Date)   SpO2 100%   Breastfeeding Unknown   BMI 34.11 kg/m    Physical Exam:  General: NAD Breasts: soft/nontender CV: RRR Pulm: nl effort, CTABL Abdomen: soft, NT, BS x 4 Incision: periumbilical incision: Dsg CDI- old drainage marked, no increase since marking. No erythema.  Perineum: minimal edema, laceration repair well approximated Lochia: small Uterine Fundus: fundus firm and 2 fb below umbilicus DVT Evaluation: no cords, ttp LEs   Recent Labs    05/11/19 0501 05/12/19 0454  HGB 10.7* 8.7*  HCT 32.4* 26.8*  WBC 6.9 8.7  PLT 225 219    Assessment/Plan: 29 y.o. [id number] postpartum day # 2  - Continue routine PP care - formula feeding, has put baby to breast a few times. encouraged snug fitting bra and cabbage leaves for bottlefeeding.  - Discussed contraceptive options including implant, IUDs hormonal and non-hormonal, injection, pills/ring/patch, condoms, and NFP.  - Acute blood loss anemia - hemodynamically stable and asymptomatic; start po ferrous sulfate BID with stool softeners  - Immunization status: all Imms up to date, declined flu and tdap.     Disposition: Does desire Dc home today.     Francetta Found, CNM 05/13/2019  9:02 AM

## 2019-05-15 LAB — SURGICAL PATHOLOGY

## 2019-05-16 NOTE — Anesthesia Postprocedure Evaluation (Signed)
Anesthesia Post Note  Patient: Kathy Hardin  Procedure(s) Performed: POST PARTUM TUBAL LIGATION (Bilateral )  Patient location during evaluation: PACU Anesthesia Type: Spinal Level of consciousness: oriented and awake and alert Pain management: pain level controlled Vital Signs Assessment: post-procedure vital signs reviewed and stable Respiratory status: spontaneous breathing, respiratory function stable and patient connected to nasal cannula oxygen Cardiovascular status: blood pressure returned to baseline and stable Postop Assessment: no headache, no backache and no apparent nausea or vomiting Anesthetic complications: no     Last Vitals:  Vitals:   05/12/19 2320 05/13/19 0748  BP: 121/69 114/75  Pulse: 79 81  Resp: 18 18  Temp: 36.6 C 36.4 C  SpO2: 100% 100%    Last Pain:  Vitals:   05/13/19 0748  TempSrc: Oral  PainSc:                  Martha Clan

## 2019-05-28 ENCOUNTER — Encounter: Payer: Self-pay | Admitting: *Deleted

## 2019-07-18 IMAGING — US US OB TRANSVAGINAL
1 series · 14 of 28 positions shown · non-contrast
Comparison: None.

CLINICAL DATA: Vaginal bleeding

EXAM:
OBSTETRIC <14 WK US AND TRANSVAGINAL OB US
TECHNIQUE: Both transabdominal and transvaginal ultrasound examinations were
performed for complete evaluation of the gestation as well as the
maternal uterus, adnexal regions, and pelvic cul-de-sac.
Transvaginal technique was performed to assess early pregnancy.

[Series 1: us ob transvaginal · 0.15mm/px · 14 of 114 slices shown]
[im 5/114]
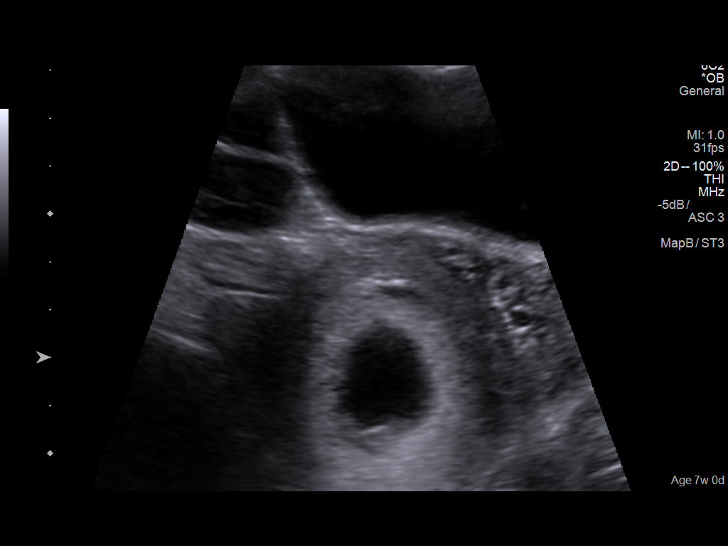
[im 13/114]
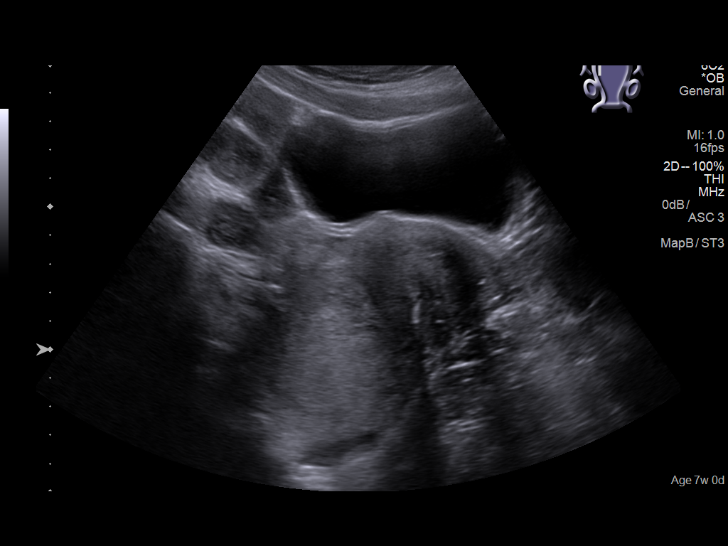
[im 21/114]
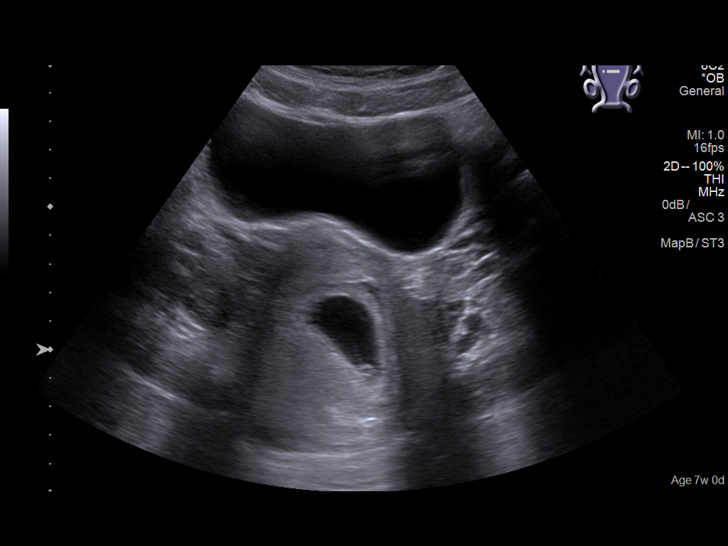
[im 30/114]
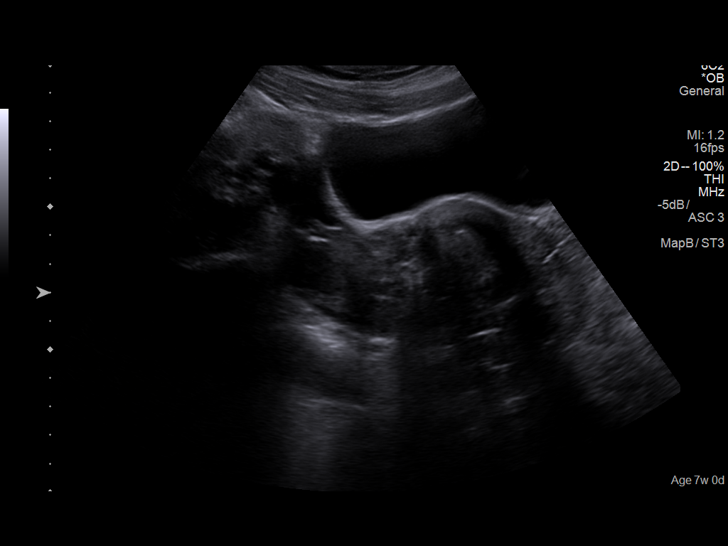
[im 38/114]
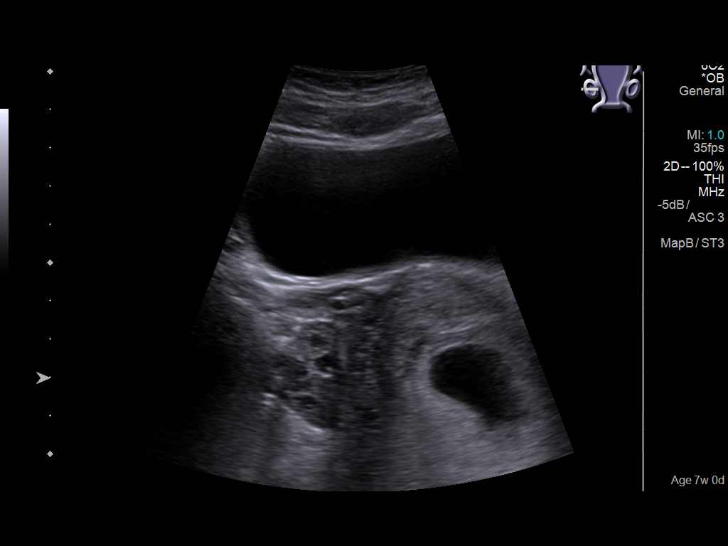
[im 47/114]
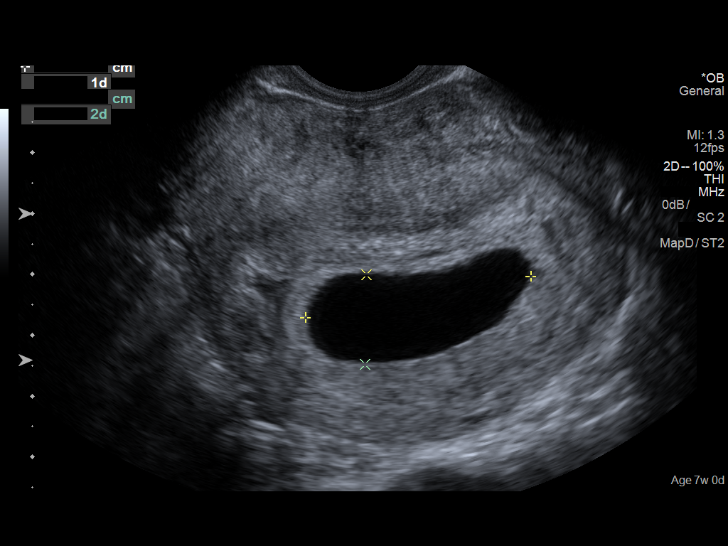
[im 55/114]
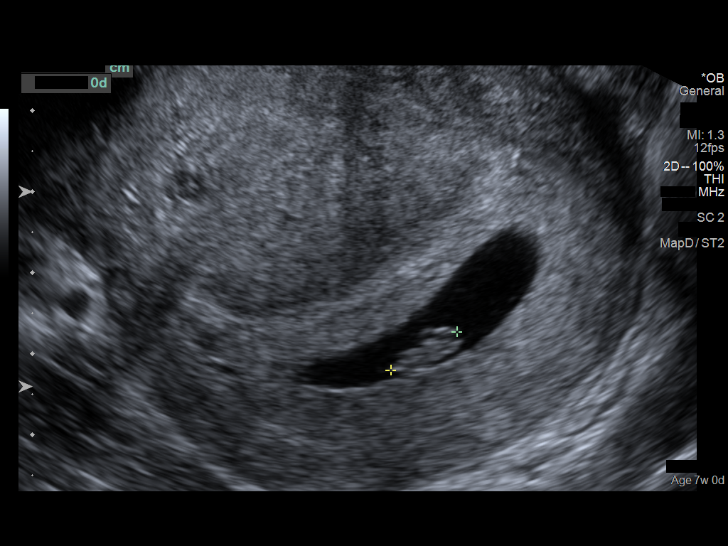
[im 63/114]
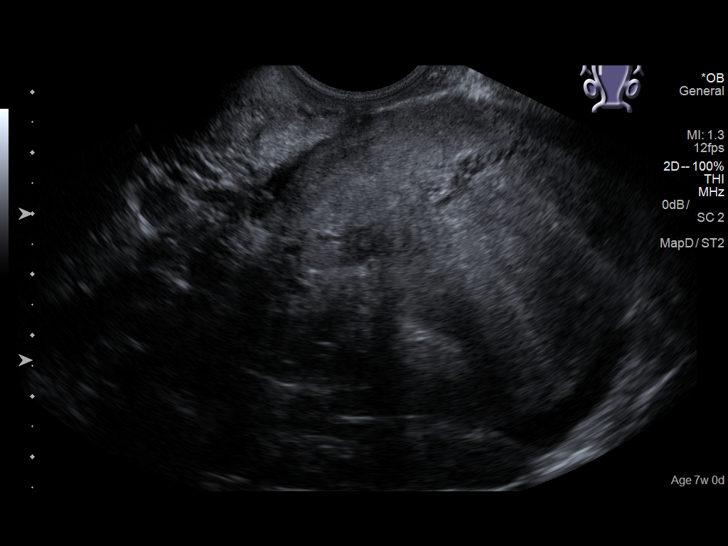
[im 72/114]
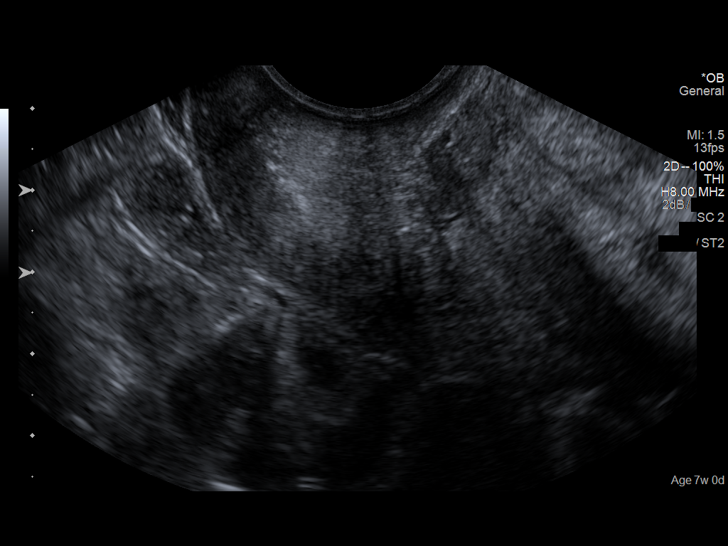
[im 80/114]
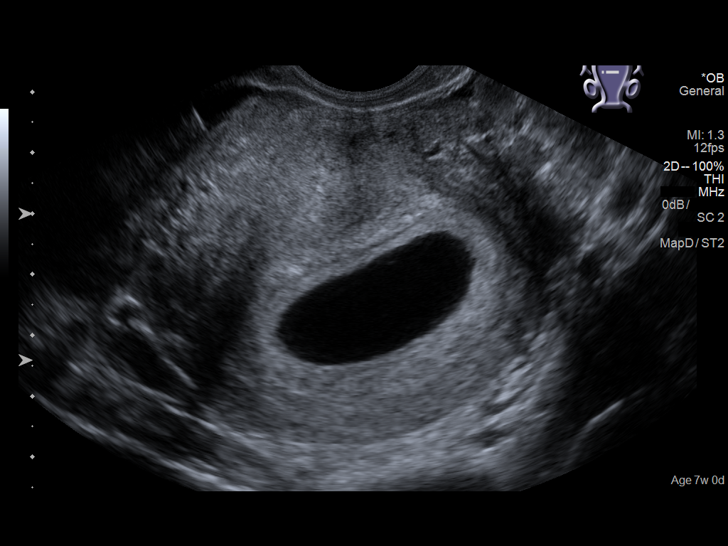
[im 88/114]
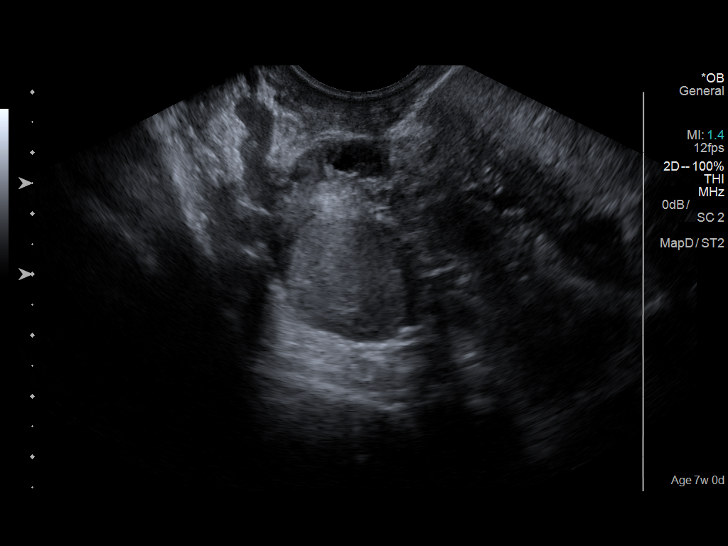
[im 97/114]
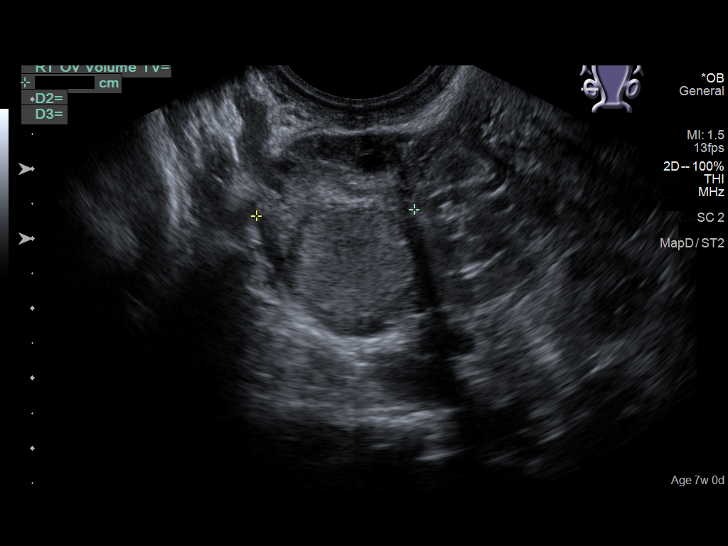
[im 105/114]
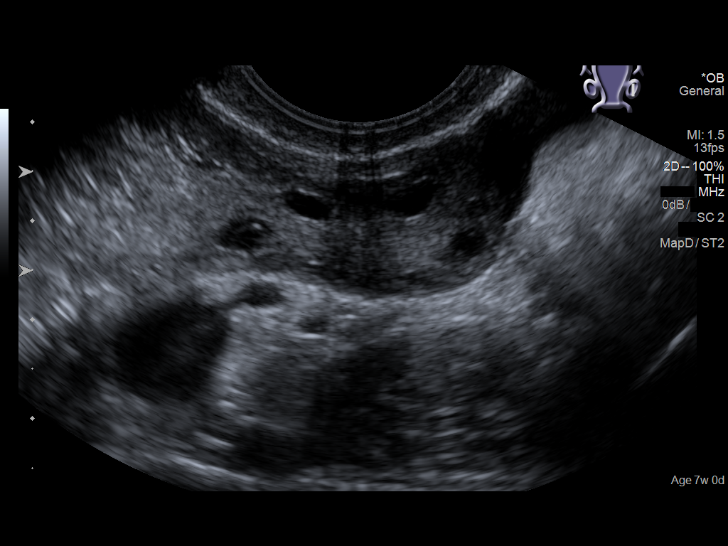
[im 114/114]
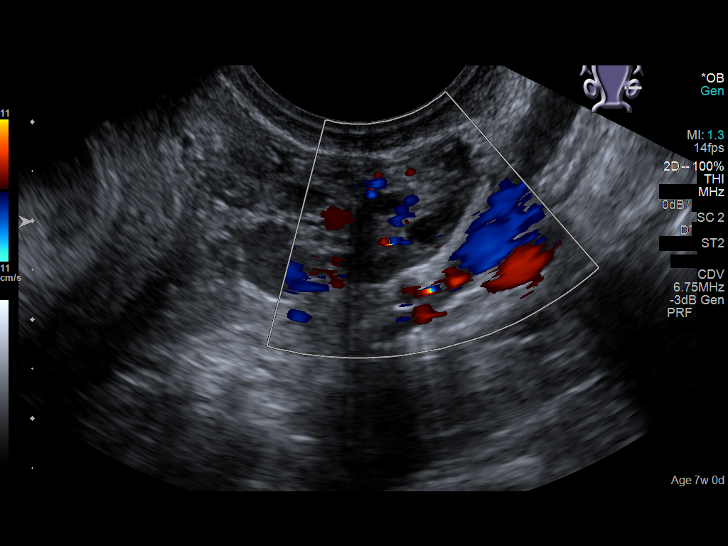

[14 of 28 positions shown; findings below may reference images not displayed]

FINDINGS: Intrauterine gestational sac: Visualized

Yolk sac:  Visualized

Embryo:  Visualized

Cardiac Activity: Visualized

Heart Rate: 135 bpm

CRL:  9 mm   7 w   0 d                  US EDC: December 29, 2017

Subchorionic hemorrhage: There is a subchorionic hemorrhage
measuring 1.4 x 0.6 cm.

Maternal uterus/adnexae: Cervical os is closed. Maternal ovaries
appear normal bilaterally. There is a 2.5 x 1.8 x 2.1 cm corpus
luteum on the right. No free pelvic fluid.
IMPRESSION: Single live intrauterine gestation with estimated gestational age of
7 weeks. Small subchorionic hemorrhage noted.

## 2019-07-24 ENCOUNTER — Emergency Department
Admission: EM | Admit: 2019-07-24 | Discharge: 2019-07-24 | Disposition: A | Discharge status: Home / Self Care | Payer: Medicaid Other | Attending: Emergency Medicine | Admitting: Emergency Medicine

## 2019-07-24 ENCOUNTER — Other Ambulatory Visit: Payer: Self-pay

## 2019-07-24 DIAGNOSIS — H9201 Otalgia, right ear: Secondary | ICD-10-CM | POA: Insufficient documentation

## 2019-07-24 DIAGNOSIS — H6981 Other specified disorders of Eustachian tube, right ear: Secondary | ICD-10-CM

## 2019-07-24 DIAGNOSIS — H6991 Unspecified Eustachian tube disorder, right ear: Secondary | ICD-10-CM | POA: Insufficient documentation

## 2019-07-24 MED ORDER — IBUPROFEN 600 MG PO TABS: 600.0000 mg | ORAL_TABLET | Freq: Three times a day (TID) | ORAL | 0 refills | Status: DC | PRN

## 2019-07-24 MED ORDER — TRAMADOL HCL 50 MG PO TABS: 50.0000 mg | ORAL_TABLET | Freq: Four times a day (QID) | ORAL | 0 refills | Status: AC | PRN

## 2019-07-24 MED ORDER — AMOXICILLIN 500 MG PO CAPS: 500.0000 mg | ORAL_CAPSULE | Freq: Three times a day (TID) | ORAL | 0 refills | Status: DC

## 2019-07-24 MED ORDER — FEXOFENADINE-PSEUDOEPHED ER 60-120 MG PO TB12: 1.0000 | ORAL_TABLET | Freq: Two times a day (BID) | ORAL | 0 refills | Status: DC

## 2019-07-24 NOTE — ED Provider Notes (Signed)
[facility] Emergency Department Provider Note   ____________________________________________   First MD Initiated Contact with Patient 07/24/19 1206     (approximate)  I have reviewed the triage vital signs and the nursing notes.   HISTORY  Chief Complaint Otalgia    HPI Kathy Hardin is a 29 y.o. female patient complain of right hip pain for 3 days.  Patient states she has tried over-the-counter eardrops with no relief.  Patient states pain increased with swallowing.  Patient states decreased hearing loss in the right ear.  Left ear is not affected.  Patient denies fever associated with complaint.  Patient denies other URI signs and symptoms.         Past Medical History:  Diagnosis Date  . IDA (iron deficiency anemia) 04/06/2019  . NVD (normal vaginal delivery) 10/31/2010    Patient Active Problem List   Diagnosis Date Noted  . Encounter for elective induction of labor 05/11/2019  . Uterine contractions during pregnancy 05/11/2019  . IDA (iron deficiency anemia) 04/06/2019  . Encounter for supervision of normal first pregnancy in first trimester 01/23/2019  . NVD (normal vaginal delivery) 10/31/2010    Past Surgical History:  Procedure Laterality Date  . NO PAST SURGERIES    . TUBAL LIGATION Bilateral 05/12/2019   Procedure: POST PARTUM TUBAL LIGATION;  Surgeon: Schermerhorn, Gwen Her, MD;  Location: [facility] ORS;  Service: Gynecology;  Laterality: Bilateral;    Prior to Admission medications   Medication Sig Start Date End Date Taking? Authorizing Provider  acetaminophen (TYLENOL) 325 MG tablet Take 2 tablets (650 mg total) by mouth every 4 (four) hours as needed (for pain scale < 4). 05/13/19   McVey, Murray Hodgkins, CNM  amoxicillin (AMOXIL) 500 MG capsule Take 1 capsule (500 mg total) by mouth 3 (three) times daily. 07/24/19   Sable Feil, PA-C  ferrous sulfate 325 (65 FE) MG tablet Take 1 tablet (325 mg total) by mouth 2 (two) times daily  with a meal. 05/13/19   McVey, Murray Hodgkins, CNM  fexofenadine-pseudoephedrine (ALLEGRA-D) 60-120 MG 12 hr tablet Take 1 tablet by mouth 2 (two) times daily. 07/24/19   Sable Feil, PA-C  ibuprofen (ADVIL) 600 MG tablet Take 1 tablet (600 mg total) by mouth every 6 (six) hours. 05/13/19   McVey, Murray Hodgkins, CNM  ibuprofen (ADVIL) 600 MG tablet Take 1 tablet (600 mg total) by mouth every 8 (eight) hours as needed. 07/24/19   Sable Feil, PA-C  Prenatal Vit-Fe Fumarate-FA (PRENATAL VITAMIN PLUS LOW IRON PO) Take by mouth.    [provider]  traMADol (ULTRAM) 50 MG tablet Take 1 tablet (50 mg total) by mouth every 6 (six) hours as needed. 07/24/19 07/23/20  Sable Feil, PA-C    Allergies Aspirin-acetaminophen-caffeine, Aspirin, and Oxycodone  Family History  Family history unknown: Yes    Social History Social History   Tobacco Use  . Smoking status: Never Smoker  . Smokeless tobacco: Never Used  Substance Use Topics  . Alcohol use: No  . Drug use: No    Review of Systems Constitutional: No fever/chills Eyes: No visual changes. ENT: No sore throat.  Right ear pain. Cardiovascular: Denies chest pain. Respiratory: Denies shortness of breath. Gastrointestinal: No abdominal pain.  No nausea, no vomiting.  No diarrhea.  No constipation. Genitourinary: Negative for dysuria. Musculoskeletal: Negative for back pain. Skin: Negative for rash. Neurological: Negative for headaches, focal weakness or numbness. Allergic/Immunilogical: Aspirin oxycodone. ____________________________________________   PHYSICAL EXAM:  VITAL SIGNS: ED Triage Vitals [07/24/19 1122]  Enc Vitals Group     BP (!) 121/98     Pulse Rate (!) 106     Resp 17     Temp 98.2 F (36.8 C)     Temp src      SpO2 96 %     Weight 185 lb (83.9 kg)     Height 5\' 5"  (1.651 m)     Head Circumference      Peak Flow      Pain Score 5     Pain Loc      Pain Edu?      Excl. in [place]?     Constitutional:  Alert and oriented. Well appearing and in no acute distress. Nose: No congestion/rhinnorhea. EARS: Right edematous and erythematous TM. Mouth/Throat: Mucous membranes are moist.  Oropharynx non-erythematous. Neck: No stridor.   Hematological/Lymphatic/Immunilogical: No cervical lymphadenopathy. Cardiovascular: Normal rate, regular rhythm. Grossly normal heart sounds.  Good peripheral circulation. Respiratory: Normal respiratory effort.  No retractions. Lungs CTAB.  ____________________________________________   LABS (all labs ordered are listed, but only abnormal results are displayed)  Labs Reviewed - No data to display ____________________________________________  EKG   ____________________________________________  RADIOLOGY  ED MD interpretation:    Official radiology report(s): No results found.  ____________________________________________   PROCEDURES  Procedure(s) performed (including Critical Care):  Procedures   ____________________________________________   INITIAL IMPRESSION / ASSESSMENT AND PLAN / ED COURSE  As part of my medical decision making, I reviewed the following data within the [place]      Patient presents with 3 days of right ear pain.  Patient physical exam consistent with otitis media and eustachian tube dysfunction.  Patient given discharge care instruction advised take medication as directed.  Patient advised follow-up with ENT clinic if no improvement in 3 to 5 days.  Return to ED if condition worsens.   Kathy Hardin was evaluated in Emergency Department on 07/24/2019 for the symptoms described in the history of present illness. She was evaluated in the context of the global COVID-19 pandemic, which necessitated consideration that the patient might be at risk for infection with the SARS-CoV-2 virus that causes COVID-19. Institutional protocols and algorithms that pertain to the evaluation of patients at risk for  COVID-19 are in a state of rapid change based on information released by regulatory bodies including the CDC and federal and state organizations. These policies and algorithms were followed during the patient's care in the ED.       ____________________________________________   FINAL CLINICAL IMPRESSION(S) / ED DIAGNOSES  Final diagnoses:  Otalgia of right ear  Eustachian tube dysfunction, right     ED Discharge Orders         Ordered    amoxicillin (AMOXIL) 500 MG capsule  3 times daily     07/24/19 1231    fexofenadine-pseudoephedrine (ALLEGRA-D) 60-120 MG 12 hr tablet  2 times daily     07/24/19 1231    traMADol (ULTRAM) 50 MG tablet  Every 6 hours PRN     07/24/19 1231    ibuprofen (ADVIL) 600 MG tablet  Every 8 hours PRN     07/24/19 1231           Note:  This document was prepared using Dragon voice recognition software and may include unintentional dictation errors.    Sable Feil, PA-C 07/24/19 1235    Earleen Newport, MD 07/24/19 1336

## 2019-07-24 NOTE — ED Triage Notes (Signed)
Pt comes via POV from home with c/o 3 day right ear pain. Pt states she tried drops with no relief.  Pt states it hurts when she swallows too.

## 2020-08-27 ENCOUNTER — Ambulatory Visit: Payer: Self-pay

## 2020-08-27 ENCOUNTER — Ambulatory Visit: Payer: Medicaid Other | Admitting: Physician Assistant

## 2020-08-27 ENCOUNTER — Other Ambulatory Visit: Payer: Self-pay

## 2020-08-27 DIAGNOSIS — Z113 Encounter for screening for infections with a predominantly sexual mode of transmission: Secondary | ICD-10-CM

## 2020-08-27 LAB — WET PREP FOR TRICH, YEAST, CLUE
Clue Cell Exam: POSITIVE — AB
Trichomonas Exam: NEGATIVE
Yeast Exam: NEGATIVE

## 2020-08-27 NOTE — Progress Notes (Signed)
Pt here for STD screening.  Wet mount results reviewed, no medication needed per SO. Windle Guard, RN

## 2020-08-29 ENCOUNTER — Encounter: Payer: Self-pay | Admitting: Physician Assistant

## 2020-08-29 NOTE — Progress Notes (Signed)
[facility] Department STI clinic/screening visit  Subjective:  Kathy Hardin is a 30 y.o. female being seen today for an STI screening visit. The patient reports they do not have symptoms.  Patient reports that they do not desire a pregnancy in the next year.   They reported they are not interested in discussing contraception today.  Patient's last menstrual period was 08/23/2020.   Patient has the following medical conditions:   Patient Active Problem List   Diagnosis Date Noted  . Encounter for elective induction of labor 05/11/2019  . Uterine contractions during pregnancy 05/11/2019  . IDA (iron deficiency anemia) 04/06/2019  . Encounter for supervision of normal first pregnancy in first trimester 01/23/2019  . NVD (normal vaginal delivery) 10/31/2010    Chief Complaint  Patient presents with  . SEXUALLY TRANSMITTED DISEASE    screening    HPI  Patient reports that she is not having any symptoms but would like a screening today.  Denies chronic conditions and regular medicines.  States that her last HIV test was in January of 2021 and last pap was in 2019.  LMP was 08/23/2020 and has had a BTL for BCM.  See flowsheet for further details and programmatic requirements.    The following portions of the patient's history were reviewed and updated as appropriate: allergies, current medications, past medical history, past social history, past surgical history and problem list.  Objective:  There were no vitals filed for this visit.  Physical Exam Constitutional:      General: She is not in acute distress.    Appearance: Normal appearance.  HENT:     Head: Normocephalic and atraumatic.     Comments: No nits,lice, or hair loss. No cervical, supraclavicular or axillary adenopathy.    Mouth/Throat:     Mouth: Mucous membranes are moist.     Pharynx: Oropharynx is clear. No oropharyngeal exudate or posterior oropharyngeal erythema.  Eyes:     Conjunctiva/sclera:  Conjunctivae normal.  Pulmonary:     Effort: Pulmonary effort is normal.  Abdominal:     Palpations: Abdomen is soft. There is no mass.     Tenderness: There is no abdominal tenderness. There is no guarding or rebound.  Genitourinary:    General: Normal vulva.     Rectum: Normal.     Comments: External genitalia/pubic area without nits, lice, edema, erythema, lesions and inguinal adenopathy. Vagina with normal mucosa and small amount of menstrual blood present. Cervix without visible lesions. Uterus firm, mobile, nt, no masses, no CMT, no adnexal tenderness or fullness. Musculoskeletal:     Cervical back: Neck supple. No tenderness.  Skin:    General: Skin is warm and dry.     Findings: No bruising, erythema, lesion or rash.  Neurological:     Mental Status: She is alert and oriented to person, place, and time.  Psychiatric:        Mood and Affect: Mood normal.        Behavior: Behavior normal.        Thought Content: Thought content normal.        Judgment: Judgment normal.      Assessment and Plan:  Kathy Hardin is a 30 y.o. female presenting to the [facility] Department for STI screening  1. Screening for STD (sexually transmitted disease) Patient into clinic without symptoms. Rec condoms with all sex. Await test results.  Counseled that RN will call if needs to RTC for treatment once results are  back. - WET PREP FOR TRICH, YEAST, CLUE - Chlamydia/Gonorrhea [place] [facility] - HIV [place] [facility] - Syphilis Serology,  Lab     No follow-ups on file.  No future appointments.  Jerene Dilling, PA

## 2020-09-03 LAB — HM HIV SCREENING LAB: HM HIV Screening: NEGATIVE

## 2020-09-11 ENCOUNTER — Other Ambulatory Visit: Payer: Self-pay | Admitting: Physician Assistant

## 2020-09-11 NOTE — Addendum Note (Signed)
Addended by: Antoine Primas on: 09/11/2020 11:39 AM   Modules accepted: Orders

## 2020-12-26 ENCOUNTER — Ambulatory Visit: Payer: Medicaid Other

## 2021-01-01 ENCOUNTER — Ambulatory Visit: Payer: Medicaid Other

## 2021-08-28 ENCOUNTER — Ambulatory Visit: Payer: Medicaid Other | Admitting: Family Medicine

## 2021-08-28 ENCOUNTER — Encounter: Payer: Self-pay | Admitting: Family Medicine

## 2021-08-28 ENCOUNTER — Encounter: Payer: Self-pay | Admitting: Oncology

## 2021-08-28 ENCOUNTER — Ambulatory Visit: Payer: Medicaid Other

## 2021-08-28 DIAGNOSIS — Z113 Encounter for screening for infections with a predominantly sexual mode of transmission: Secondary | ICD-10-CM

## 2021-08-28 LAB — WET PREP FOR TRICH, YEAST, CLUE
Trichomonas Exam: NEGATIVE
Yeast Exam: NEGATIVE

## 2021-08-28 LAB — HM HIV SCREENING LAB: HM HIV Screening: NEGATIVE

## 2021-08-28 NOTE — Progress Notes (Signed)
Pt here for STD screening.  Wet mount results reviewed, no treatment required per SO.  Condoms declined.  Edyn Qazi M Camrie Stock, RN  

## 2021-08-28 NOTE — Progress Notes (Signed)
[facility] Department  STI clinic/screening visit [place] [address] [phone]  Subjective:  Kathy Hardin is a 31 y.o. female being seen today for an STI screening visit. The patient reports they do not have symptoms.  Patient reports that they do not desire a pregnancy in the next year.   They reported they are not interested in discussing contraception today.    Patient's last menstrual period was 08/06/2021 (exact date).   Patient has the following medical conditions:   Patient Active Problem List   Diagnosis Date Noted   Encounter for elective induction of labor 05/11/2019   Uterine contractions during pregnancy 05/11/2019   IDA (iron deficiency anemia) 04/06/2019   Encounter for supervision of normal first pregnancy in first trimester 01/23/2019   NVD (normal vaginal delivery) 10/31/2010    Chief Complaint  Patient presents with   SEXUALLY TRANSMITTED DISEASE    Screening    HPI  Patient reports here for screening, denies s/sx   Last HIV test per patient/review of record was 08/27/2020 Patient reports last pap was > 5 year ago.   Screening for MPX risk: Does the patient have an unexplained rash? No Is the patient MSM? No Does the patient endorse multiple sex partners or anonymous sex partners? No Did the patient have close or sexual contact with a person diagnosed with MPX? No Has the patient traveled outside the [place] where MPX is endemic? No Is there a high clinical suspicion for MPX-- evidenced by one of the following No  -Unlikely to be chickenpox  -Lymphadenopathy  -Rash that present in same phase of evolution on any given body part See flowsheet for further details and programmatic requirements.   Immunization history:  There is no immunization history for the selected administration types on file for this patient.   The following portions of the patient's history were reviewed and updated as appropriate: allergies,  current medications, past medical history, past social history, past surgical history and problem list.  Objective:  There were no vitals filed for this visit.  Physical Exam Vitals and nursing note reviewed.  Constitutional:      Appearance: Normal appearance.  HENT:     Head: Normocephalic and atraumatic.     Mouth/Throat:     Mouth: Mucous membranes are moist.     Pharynx: Oropharynx is clear. No oropharyngeal exudate or posterior oropharyngeal erythema.  Pulmonary:     Effort: Pulmonary effort is normal.  Abdominal:     General: Abdomen is flat.     Palpations: There is no mass.     Tenderness: There is no abdominal tenderness. There is no rebound.  Genitourinary:    General: Normal vulva.     Exam position: Lithotomy position.     Pubic Area: No rash or pubic lice.      Labia:        Right: No rash or lesion.        Left: No rash or lesion.      Vagina: Normal. No vaginal discharge, erythema, bleeding or lesions.     Cervix: No cervical motion tenderness, discharge, friability, lesion or erythema.     Uterus: Normal.      Adnexa: Right adnexa normal and left adnexa normal.     Rectum: Normal.     Comments: External genitalia without, lice, nits, erythema, edema , lesions or inguinal adenopathy. Vagina with normal mucosa and discharge and pH equals 4.  Cervix without visual lesions,  uterus firm, mobile, non-tender, no masses, CMT adnexal fullness or tenderness.   Lymphadenopathy:     Head:     Right side of head: No preauricular or posterior auricular adenopathy.     Left side of head: No preauricular or posterior auricular adenopathy.     Cervical: No cervical adenopathy.     Upper Body:     Right upper body: No supraclavicular or axillary adenopathy.     Left upper body: No supraclavicular or axillary adenopathy.     Lower Body: No right inguinal adenopathy. No left inguinal adenopathy.  Skin:    General: Skin is warm and dry.     Findings: No rash.  Neurological:      Mental Status: She is alert and oriented to person, place, and time.  Psychiatric:        Mood and Affect: Mood normal.        Behavior: Behavior normal.     Assessment and Plan:  Kathy Hardin is a 31 y.o. female presenting to the [facility] Department for STI screening  1. Screening examination for venereal disease Patient accepted all screenings including wet prep, vaginal CT/GC and bloodwork for HIV/RPR.  Patient meets criteria for HepB screening? No. Ordered? No -   Patient meets criteria for HepC screening? No. Ordered? No -    Wet prep results neg    Treatment needed  Discussed time line for State Lab results and that patient will be called with positive results and encouraged patient to call if she had not heard in 2 weeks.  Counseled to return or seek care for continued or worsening symptoms Recommended condom use with all sex  Patient is currently using Postpartum Sterilization to prevent pregnancy.   - Chlamydia/Gonorrhea [place] [facility] - HIV [place] [facility] - WET PREP FOR [place], YEAST, CLUE - Syphilis Serology, [place] [facility]     Return for as needed.  No future appointments.  Junious Dresser, FNP

## 2021-10-23 ENCOUNTER — Encounter: Payer: Self-pay | Admitting: Oncology

## 2021-11-05 ENCOUNTER — Encounter: Payer: Self-pay | Admitting: Oncology

## 2021-11-05 ENCOUNTER — Encounter: Payer: Self-pay | Admitting: Emergency Medicine

## 2021-11-05 ENCOUNTER — Emergency Department
Admission: EM | Admit: 2021-11-05 | Discharge: 2021-11-05 | Disposition: A | Discharge status: Home / Self Care | Payer: Medicaid Other | Attending: Emergency Medicine | Admitting: Emergency Medicine

## 2021-11-05 DIAGNOSIS — L02215 Cutaneous abscess of perineum: Secondary | ICD-10-CM | POA: Insufficient documentation

## 2021-11-05 DIAGNOSIS — L0291 Cutaneous abscess, unspecified: Secondary | ICD-10-CM

## 2021-11-05 MED ORDER — SULFAMETHOXAZOLE-TRIMETHOPRIM 800-160 MG PO TABS
1.0000 | ORAL_TABLET | Freq: Once | ORAL | Status: AC
  Administered 2021-11-05: 1 via ORAL
  Filled 2021-11-05: qty 1

## 2021-11-05 MED ORDER — SULFAMETHOXAZOLE-TRIMETHOPRIM 800-160 MG PO TABS: 1.0000 | ORAL_TABLET | Freq: Two times a day (BID) | ORAL | 0 refills | Status: AC

## 2021-11-05 NOTE — ED Triage Notes (Signed)
Pt with c/o "bump" in groin area with no drainage or pain. Pt sts she noticed today.

## 2021-11-05 NOTE — Discharge Instructions (Signed)
Please take medication as prescribed.  Continue to monitor bump, bump/boil should resolve if any increasing size, pain, swelling return to the emergency department

## 2021-11-05 NOTE — ED Provider Notes (Signed)
[place] EMERGENCY DEPARTMENT Provider Note   CSN: [id number] Arrival date & time: 11/05/21  1934     History  Chief Complaint  Patient presents with   Abscess    Kathy Hardin is a 31 y.o. female.  Presents to the emergency department for evaluation of boil along her perineal region.  Patient has a small boil that she noticed today while looking in the mirror.  She states she recently shaved the area, is concerned about possible abscess or sexually-transmitted disease.  No vaginal discharge.  She denies any significant pain and no other lesions.  No fevers.  No pain with ambulation.  No pain to touch.  HPI     Home Medications Prior to Admission medications   Medication Sig Start Date End Date Taking? Authorizing Provider  sulfamethoxazole-trimethoprim (BACTRIM DS) 800-160 MG tablet Take 1 tablet by mouth 2 (two) times daily. 11/05/21  Yes Duanne Guess, PA-C      Allergies    Aspirin-acetaminophen-caffeine, Aspirin, and Oxycodone    Review of Systems   Review of Systems  Physical Exam Updated Vital Signs BP (!) 148/87   Pulse 87   Resp 18   SpO2 99%  Physical Exam Constitutional:      Appearance: She is well-developed.  HENT:     Head: Normocephalic and atraumatic.  Eyes:     Conjunctiva/sclera: Conjunctivae normal.  Cardiovascular:     Rate and Rhythm: Normal rate.  Pulmonary:     Effort: Pulmonary effort is normal. No respiratory distress.  Genitourinary:    Comments: Mid perineal region has a small 0.  Two 5 cm slightly raised bump that is nontender to palpation, this looks like an early abscess.  Does not appear to mimic any type of sexually transmitted disease.  No induration or surrounding cellulitis.  No fluctuant abscess present.  No skin discoloration Musculoskeletal:        General: Normal range of motion.     Cervical back: Normal range of motion.  Skin:    General: Skin is warm.     Findings: No rash.  Neurological:      Mental Status: She is alert and oriented to person, place, and time.  Psychiatric:        Behavior: Behavior normal.        Thought Content: Thought content normal.     ED Results / Procedures / Treatments   Labs (all labs ordered are listed, but only abnormal results are displayed) Labs Reviewed - No data to display  EKG None  Radiology No results found.  Procedures Procedures    Medications Ordered in ED Medications  sulfamethoxazole-trimethoprim (BACTRIM DS) 800-160 MG per tablet 1 tablet (1 tablet Oral Given 11/05/21 2101)    ED Course/ Medical Decision Making/ A&P                           Medical Decision Making Risk Prescription drug management.   31 year old female with a single small perineal bump, likely early abscess.  No sign of STD.  Single bump is nontender to palpation with no fluctuance or surrounding induration or cellulitis.  Patient is very concerned about this and is requesting an antibiotic.  Will place on Bactrim DS and she will use warm compresses and she understands signs and symptoms return to the ER for. Final Clinical Impression(s) / ED Diagnoses Final diagnoses:  Abscess  Perineal abscess    Rx / DC  Orders ED Discharge Orders          Ordered    sulfamethoxazole-trimethoprim (BACTRIM DS) 800-160 MG tablet  2 times daily        11/05/21 2109              Renata Caprice 11/05/21 2131    Duffy Bruce, MD 11/11/21 [phone]

## 2021-11-05 NOTE — ED Notes (Signed)
See triage note. Pt states "bump" is in the area between her vagina and rectum. She has never noted similar Sx before. Denies pain or drainage from site. Denies F/C, NVD, pt A&Ox4, NAD.

## 2021-11-13 ENCOUNTER — Ambulatory Visit: Payer: Medicaid Other

## 2021-12-29 ENCOUNTER — Other Ambulatory Visit: Payer: Self-pay

## 2021-12-29 ENCOUNTER — Encounter: Payer: Self-pay | Admitting: Emergency Medicine

## 2021-12-29 DIAGNOSIS — Z5321 Procedure and treatment not carried out due to patient leaving prior to being seen by health care provider: Secondary | ICD-10-CM | POA: Insufficient documentation

## 2021-12-29 DIAGNOSIS — H9201 Otalgia, right ear: Secondary | ICD-10-CM | POA: Insufficient documentation

## 2021-12-29 NOTE — ED Triage Notes (Signed)
Patient ambulatory to triage with steady gait, without difficulty or distress noted; pt reports rt earache since yesterday; denies any recent illness

## 2021-12-30 ENCOUNTER — Emergency Department
Admission: EM | Admit: 2021-12-30 | Discharge: 2021-12-30 | Discharge status: Against Medical Advice | Payer: Medicaid Other | Attending: Emergency Medicine | Admitting: Emergency Medicine

## 2021-12-31 ENCOUNTER — Encounter: Payer: Self-pay | Admitting: Intensive Care

## 2021-12-31 ENCOUNTER — Emergency Department
Admission: EM | Admit: 2021-12-31 | Discharge: 2021-12-31 | Disposition: A | Discharge status: Home / Self Care | Payer: Medicaid Other | Attending: Emergency Medicine | Admitting: Emergency Medicine

## 2021-12-31 ENCOUNTER — Other Ambulatory Visit: Payer: Self-pay

## 2021-12-31 DIAGNOSIS — H9201 Otalgia, right ear: Secondary | ICD-10-CM | POA: Diagnosis present

## 2021-12-31 DIAGNOSIS — H60391 Other infective otitis externa, right ear: Secondary | ICD-10-CM | POA: Insufficient documentation

## 2021-12-31 DIAGNOSIS — J069 Acute upper respiratory infection, unspecified: Secondary | ICD-10-CM | POA: Insufficient documentation

## 2021-12-31 MED ORDER — CIPRO HC 0.2-1 % OT SUSP: 3.0000 [drp] | Freq: Two times a day (BID) | OTIC | 0 refills | Status: DC

## 2021-12-31 MED ORDER — OFLOXACIN 0.3 % OT SOLN: 10.0000 [drp] | Freq: Every day | OTIC | 0 refills | Status: AC

## 2021-12-31 NOTE — ED Provider Notes (Signed)
[facility] Provider Note    Event Date/Time   First MD Initiated Contact with Patient 12/31/21 [phone]     (approximate)   History   Chief Complaint Ear Pain (right)   HPI Kathy Hardin is a 31 y.o. female, history of iron deficiency anemia, presents to the emergency department for evaluation of otalgia x 3 days.  Reports having some drainage and foul odor from her ear.  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  She has not been swimming recently, but does state that she has been using several different hair products in the shower.  She states that she has also been experiencing sore throat as well.  Denies any recent contact with sick individuals.  Denies fever/chills, chest pain, shortness of breath, abdominal pain, dysphagia, nausea/vomiting, diarrhea, rash/lesions, hearing changes, vision changes, or dizziness/lightheadedness.  History Limitations: No limitations.        Physical Exam  Triage Vital Signs: ED Triage Vitals  Enc Vitals Group     BP 12/31/21 0831 134/82     Pulse Rate 12/31/21 0831 89     Resp 12/31/21 0831 16     Temp --      Temp Source 12/31/21 0831 Oral     SpO2 12/31/21 0831 97 %     Weight 12/31/21 0829 190 lb (86.2 kg)     Height 12/31/21 0829 '5\' 5"'$  (1.651 m)     Head Circumference --      Peak Flow --      Pain Score 12/31/21 0829 8     Pain Loc --      Pain Edu? --      Excl. in [place]? --     Most recent vital signs: Vitals:   12/31/21 0831  BP: 134/82  Pulse: 89  Resp: 16  SpO2: 97%    General: Awake, NAD.  Skin: Warm, dry. No rashes or lesions.  Eyes: PERRL. Conjunctivae normal.  CV: Good peripheral perfusion.  Resp: Normal effort.  Abd: Soft, non-tender. No distention.  Neuro: At baseline. No gross neurological deficits.  Musculoskeletal: Normal ROM of all extremities.  Focused Exam: Erythema present in the right external auditory canal.  No bleeding or discharge noted at this time.  TM  is gray, translucent.  Normal cone of light.  No ruptures present.  No effusions.  Physical Exam    ED Results / Procedures / Treatments  Labs (all labs ordered are listed, but only abnormal results are displayed) Labs Reviewed - No data to display   EKG N/A.    RADIOLOGY  ED Provider Interpretation: N/A  No results found.  PROCEDURES:  Critical Care performed: N/A  Procedures    MEDICATIONS ORDERED IN ED: Medications - No data to display   IMPRESSION / MDM / [place] / ED COURSE  I reviewed the triage vital signs and the nursing notes.                              Differential diagnosis includes, but is not limited to, viral URI, otitis media, otitis externa, TM rupture   Assessment/Plan Patient presents with right-sided otalgia x3 days associated with discharge/odor and sore throat.  I suspect that she likely has a viral URI with possible otitis externa.  No evidence of TM rupture.  No evidence of otitis media.  Vitals are within normal limits.  Low suspicion for any  major systemic illnesses.  She appears well clinically.  We will provide her with prescription for ofloxacin otic drops.  Recommend that she continue over-the-counter medications as needed to treat her other viral URI symptoms.  We will provide her with a referral to ENT if her symptoms do not improve with this treatment.  Patient was amenable to this plan.  Will discharge.  Provided the patient with anticipatory guidance, return precautions, and educational material. Encouraged the patient to return to the emergency department at any time if they begin to experience any new or worsening symptoms. Patient expressed understanding and agreed with the plan.   Patient's presentation is most consistent with acute, uncomplicated illness.       FINAL CLINICAL IMPRESSION(S) / ED DIAGNOSES   Final diagnoses:  Viral URI  Infective otitis externa of right ear     Rx / DC Orders   ED  Discharge Orders          Ordered    ciprofloxacin-hydrocortisone (CIPRO HC) OTIC suspension  2 times daily,   Status:  Discontinued        12/31/21 0847    ofloxacin (FLOXIN) 0.3 % OTIC solution  Daily        12/31/21 0858             Note:  This document was prepared using Dragon voice recognition software and may include unintentional dictation errors.   Teodoro Spray, [place] 12/31/21 4235    Carrie Mew, MD 01/02/22 1911

## 2021-12-31 NOTE — ED Triage Notes (Signed)
Patient c/o right ear pain X3 days

## 2021-12-31 NOTE — Discharge Instructions (Addendum)
-  Please use the eardrops for the entire course.  -You may take Tylenol/ibuprofen and/or over-the-counter throat lozenges for your sore throat.  -Return to the emergency department anytime if you begin to experience any new or worsening symptoms.  -Please follow-up with the ENT provider if your symptoms do not improve in 1 week.

## 2022-09-11 ENCOUNTER — Ambulatory Visit: Payer: Medicaid Other

## 2022-12-31 ENCOUNTER — Ambulatory Visit: Payer: Medicaid Other | Admitting: Advanced Practice Midwife

## 2022-12-31 ENCOUNTER — Other Ambulatory Visit: Payer: Self-pay

## 2022-12-31 ENCOUNTER — Encounter: Payer: Self-pay | Admitting: Advanced Practice Midwife

## 2022-12-31 DIAGNOSIS — Z113 Encounter for screening for infections with a predominantly sexual mode of transmission: Secondary | ICD-10-CM

## 2022-12-31 LAB — WET PREP FOR TRICH, YEAST, CLUE
Trichomonas Exam: NEGATIVE
Yeast Exam: NEGATIVE

## 2022-12-31 LAB — HM HIV SCREENING LAB: HM HIV Screening: NEGATIVE

## 2022-12-31 NOTE — Progress Notes (Signed)
Pt is here for std screening.  Wet prep results reviewed, no treatment per standing order. Condoms declined. Gaspar Garbe, RN

## 2022-12-31 NOTE — Progress Notes (Signed)
[facility] Department  STI clinic/screening visit [address] [place] [address] [phone]  Subjective:  Kathy Hardin is a 32 y.o. female being seen today for an STI screening visit. The patient reports they do not have symptoms.  Patient reports that they do not desire a pregnancy in the next year.   They reported they are not interested in discussing contraception today.    Patient's last menstrual period was 12/31/2022 (exact date).  Patient has the following medical conditions:   Patient Active Problem List   Diagnosis Date Noted   Encounter for elective induction of labor 05/11/2019   Uterine contractions during pregnancy 05/11/2019   IDA (iron deficiency anemia) 04/06/2019   Encounter for supervision of normal first pregnancy in first trimester 01/23/2019   NVD (normal vaginal delivery) 10/31/2010    Chief Complaint  Patient presents with   SEXUALLY TRANSMITTED DISEASE    Patient is a 32 y.o. female, G4P4 who is present for STI screening. Patient is asymptomatic. No PAP on file. Clinician oversight-did not ask patient when last PAP was. Last sexual encounter 11/07/22. LMP began today.    Does the patient using douching products? No  Last HIV test per patient/review of record was  Lab Results  Component Value Date   HMHIVSCREEN Negative - Validated 08/28/2021   No results found for: "HIV" Patient reports last pap was No results found for: "DIAGPAP" No results found for: "SPECADGYN"  Screening for MPX risk: Does the patient have an unexplained rash? No Is the patient MSM? No Does the patient endorse multiple sex partners or anonymous sex partners? No Did the patient have close or sexual contact with a person diagnosed with MPX? No Has the patient traveled outside the [place] where MPX is endemic? No Is there a high clinical suspicion for MPX-- evidenced by one of the following No  -Unlikely to be chickenpox  -Lymphadenopathy  -Rash that  present in same phase of evolution on any given body part See flowsheet for further details and programmatic requirements.   Immunization history:  There is no immunization history for the selected administration types on file for this patient.   The following portions of the patient's history were reviewed and updated as appropriate: allergies, current medications, past medical history, past social history, past surgical history and problem list.  Objective:  There were no vitals filed for this visit.  Physical Exam Vitals and nursing note reviewed. Exam conducted with a chaperone present Cameron Proud, RN present as chaperone in room.).  Constitutional:      Appearance: Normal appearance.  HENT:     Head: Normocephalic and atraumatic.     Salivary Glands: Right salivary gland is not diffusely enlarged or tender. Left salivary gland is not diffusely enlarged or tender.     Mouth/Throat:     Lips: Pink. No lesions.     Mouth: Mucous membranes are moist.     Tongue: No lesions. Tongue does not deviate from midline.     Pharynx: Oropharynx is clear. Uvula midline. No oropharyngeal exudate or posterior oropharyngeal erythema.     Tonsils: No tonsillar exudate.  Eyes:     General:        Right eye: No discharge.        Left eye: No discharge.  Pulmonary:     Effort: Pulmonary effort is normal.  Abdominal:     General: Abdomen is flat.     Palpations: There is no mass.  Tenderness: There is no abdominal tenderness. There is no right CVA tenderness, left CVA tenderness, guarding or rebound.  Genitourinary:    General: Normal vulva.     Exam position: Lithotomy position.     Pubic Area: No rash or pubic lice.      Tanner stage (genital): 5.     Labia:        Right: No rash, tenderness, lesion or injury.        Left: No rash, tenderness, lesion or injury.      Urethra: No prolapse.     Vagina: No signs of injury. Bleeding present. No vaginal discharge, erythema, tenderness or  lesions.     Cervix: Cervical bleeding present. No cervical motion tenderness, discharge, friability (Unable to assess. Patient heavily menstruating.), lesion or erythema.     Uterus: Normal. Not tender.      Adnexa: Right adnexa normal and left adnexa normal.     Comments: pH = unable to determine pH. Patient was actively menstruating.  Lymphadenopathy:     Head:     Right side of head: No submental, submandibular, tonsillar, preauricular or posterior auricular adenopathy.     Left side of head: No submental, submandibular, tonsillar, preauricular or posterior auricular adenopathy.     Cervical: No cervical adenopathy.     Right cervical: No superficial or posterior cervical adenopathy.    Left cervical: No superficial or posterior cervical adenopathy.     Upper Body:     Right upper body: No supraclavicular, axillary or epitrochlear adenopathy.     Left upper body: No supraclavicular, axillary or epitrochlear adenopathy.     Lower Body: No right inguinal adenopathy. No left inguinal adenopathy.  Skin:    General: Skin is warm and dry.     Findings: No rash.     Comments: Appropriate for ethnicity.   Neurological:     Mental Status: She is alert and oriented to person, place, and time.  Psychiatric:        Attention and Perception: Attention normal.        Mood and Affect: Mood normal.        Speech: Speech normal.        Behavior: Behavior normal. Behavior is cooperative.        Thought Content: Thought content normal.    Assessment and Plan:  Kathy Hardin is a 32 y.o. female presenting to the [facility] Department for STI screening  1. Screening for venereal disease  - Chlamydia/Gonorrhea [facility] - HIV [facility] [facility] - Syphilis Serology, [facility] - WET PREP FOR TRICH, YEAST, CLUE  Patient accepted all screenings including oral, vaginal CT/GC and bloodwork for HIV/RPR, and wet prep. Patient meets criteria for HepB screening? No. Ordered? not  applicable Patient meets criteria for HepC screening? No. Ordered? not applicable  Treat wet prep per standing order Discussed time line for [facility] results and that patient will be called with positive results and encouraged patient to call if she had not heard in 2 weeks.  Counseled to return or seek care for continued or worsening symptoms Recommended repeat testing in 3 months with positive results. Recommended condom use with all sex  Patient is currently using Postpartum Sterilization to prevent pregnancy.    Return if symptoms worsen or fail to improve.  No future appointments.  Total time with patient 30 minutes.   Edmonia James, NP Attestation of Supervision of Advanced Practitioner (CNM/PA/NP): Evaluation and management  procedures were performed by the Advanced Practice Provider under my supervision and collaboration.  I have reviewed the Advanced Practice Provider's note and chart, and I agree with the management and plan. I have also made any necessary editorial changes.   I was working along side this practitioner all day and all medical plans were discussed with me.   Alberteen Spindle, CNM

## 2023-01-04 ENCOUNTER — Telehealth: Payer: Self-pay | Admitting: Family Medicine

## 2023-01-04 NOTE — Telephone Encounter (Signed)
Pt had questions on her Wet Prep.  Questions answered.  Berdie Ogren, RN

## 2023-01-04 NOTE — Telephone Encounter (Signed)
Would like Kathy Hardin to call ASAP
